# Patient Record
Sex: Female | Born: 1990 | Race: Black or African American | Hispanic: No | Marital: Single | Smoking: Never smoker
Health system: Southern US, Community
[De-identification: ages and names within clinical notes are randomized; demographics above are authoritative.]

## PROBLEM LIST (undated history)

## (undated) ENCOUNTER — Inpatient Hospital Stay (HOSPITAL_COMMUNITY): Payer: Self-pay

## (undated) DIAGNOSIS — D649 Anemia, unspecified: Secondary | ICD-10-CM

## (undated) DIAGNOSIS — N159 Renal tubulo-interstitial disease, unspecified: Secondary | ICD-10-CM

## (undated) HISTORY — PX: NO PAST SURGERIES: SHX2092

---
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
## 2009-10-16 ENCOUNTER — Emergency Department (HOSPITAL_COMMUNITY): Admission: EM | Admit: 2009-10-16 | Discharge: 2009-10-16 | Payer: Self-pay | Admitting: Emergency Medicine

## 2010-07-14 ENCOUNTER — Emergency Department (HOSPITAL_COMMUNITY)
Admission: EM | Admit: 2010-07-14 | Discharge: 2010-07-14 | Payer: Self-pay | Source: Home / Self Care | Admitting: Emergency Medicine

## 2010-07-18 LAB — WET PREP, GENITAL

## 2010-07-18 LAB — POCT URINALYSIS DIPSTICK
Bilirubin Urine: NEGATIVE
Ketones, ur: NEGATIVE mg/dL
Urine Glucose, Fasting: NEGATIVE mg/dL
Urobilinogen, UA: 0.2 mg/dL (ref 0.0–1.0)

## 2010-07-19 LAB — HERPES SIMPLEX VIRUS CULTURE: Culture: NOT DETECTED

## 2010-09-07 ENCOUNTER — Inpatient Hospital Stay (INDEPENDENT_AMBULATORY_CARE_PROVIDER_SITE_OTHER)
Admission: RE | Admit: 2010-09-07 | Discharge: 2010-09-07 | Disposition: A | Discharge status: Home / Self Care | Payer: BC Managed Care – PPO | Source: Ambulatory Visit | Attending: Family Medicine | Admitting: Family Medicine

## 2010-09-07 DIAGNOSIS — R197 Diarrhea, unspecified: Secondary | ICD-10-CM

## 2010-09-07 DIAGNOSIS — R112 Nausea with vomiting, unspecified: Secondary | ICD-10-CM

## 2010-09-07 LAB — POCT PREGNANCY, URINE: Preg Test, Ur: NEGATIVE

## 2010-09-13 LAB — WET PREP, GENITAL
Clue Cells Wet Prep HPF POC: NONE SEEN
WBC, Wet Prep HPF POC: NONE SEEN

## 2010-09-13 LAB — DIFFERENTIAL
Basophils Absolute: 0 10*3/uL (ref 0.0–0.1)
Basophils Relative: 1 % (ref 0–1)
Eosinophils Absolute: 0.2 10*3/uL (ref 0.0–0.7)
Eosinophils Relative: 5 % (ref 0–5)
Lymphocytes Relative: 36 % (ref 12–46)
Neutro Abs: 2.1 10*3/uL (ref 1.7–7.7)

## 2010-09-13 LAB — CBC
HCT: 35.4 % — ABNORMAL LOW (ref 36.0–46.0)
MCHC: 31.3 g/dL (ref 30.0–36.0)
MCV: 67 fL — ABNORMAL LOW (ref 78.0–100.0)
Platelets: 292 10*3/uL (ref 150–400)
RDW: 16.8 % — ABNORMAL HIGH (ref 11.5–15.5)

## 2010-09-13 LAB — POCT I-STAT, CHEM 8
Calcium, Ion: 1.2 mmol/L (ref 1.12–1.32)
Creatinine, Ser: 0.7 mg/dL (ref 0.4–1.2)
HCT: 40 % (ref 36.0–46.0)
Sodium: 139 mEq/L (ref 135–145)
TCO2: 25 mmol/L (ref 0–100)

## 2010-09-13 LAB — URINALYSIS, ROUTINE W REFLEX MICROSCOPIC
Bilirubin Urine: NEGATIVE
Hgb urine dipstick: NEGATIVE
Ketones, ur: NEGATIVE mg/dL
Specific Gravity, Urine: 1.025 (ref 1.005–1.030)

## 2010-09-13 LAB — GC/CHLAMYDIA PROBE AMP, GENITAL: GC Probe Amp, Genital: NEGATIVE

## 2010-09-13 LAB — POCT PREGNANCY, URINE: Preg Test, Ur: NEGATIVE

## 2011-09-19 ENCOUNTER — Emergency Department (INDEPENDENT_AMBULATORY_CARE_PROVIDER_SITE_OTHER)
Admission: EM | Admit: 2011-09-19 | Discharge: 2011-09-19 | Disposition: A | Discharge status: Home / Self Care | Payer: BC Managed Care – PPO | Source: Home / Self Care

## 2011-09-19 ENCOUNTER — Encounter (HOSPITAL_COMMUNITY): Payer: Self-pay | Admitting: Emergency Medicine

## 2011-09-19 DIAGNOSIS — L03119 Cellulitis of unspecified part of limb: Secondary | ICD-10-CM

## 2011-09-19 DIAGNOSIS — L02419 Cutaneous abscess of limb, unspecified: Secondary | ICD-10-CM

## 2011-09-19 MED ORDER — DOXYCYCLINE HYCLATE 100 MG PO CAPS: 100.0000 mg | ORAL_CAPSULE | Freq: Two times a day (BID) | ORAL | Status: AC

## 2011-09-19 NOTE — ED Provider Notes (Signed)
History     CSN: [id number]  Arrival date & time 09/19/11  1043   None     Chief Complaint  Patient presents with  . Insect Bite    (Consider location/radiation/quality/duration/timing/severity/associated sxs/prior treatment) HPI Comments: Patient presents today with complaints of a sore red area to her lateral left knee. No drainage.This began 2 days ago and is gradually increasing in size. No injury. No history of same. She has been taking Benadryl for her symptoms without improvement. She has not tried any over-the-counter pain relievers.   History reviewed. No pertinent past medical history.  History reviewed. No pertinent past surgical history.  History reviewed. No pertinent family history.  History  Substance Use Topics  . Smoking status: Never Smoker   . Smokeless tobacco: Not on file  . Alcohol Use: No    OB History    Grav Para Term Preterm Abortions TAB SAB Ect Mult Living                  Review of Systems  Constitutional: Negative for fever and chills.  Musculoskeletal: Negative for joint swelling.  Skin: Negative for wound.    Allergies  Review of patient's allergies indicates no known allergies.  Home Medications   Current Outpatient Rx  Name Route Sig Dispense Refill  . DOXYCYCLINE HYCLATE 100 MG PO CAPS Oral Take 1 capsule (100 mg total) by mouth 2 (two) times daily. 20 capsule 0    BP 96/58  Pulse 90  Temp(Src) 97.3 F (36.3 C) (Oral)  Resp 14  SpO2 99%  Physical Exam  Nursing note and vitals reviewed. Constitutional: She appears well-developed and well-nourished. No distress.  Musculoskeletal:       Left knee: She exhibits erythema. She exhibits normal range of motion, no swelling, no effusion, no laceration and no bony tenderness.       Legs: Neurological: She is alert.  Skin: Skin is warm and dry.  Psychiatric: She has a normal mood and affect.    ED Course  Procedures (including critical care time)  Labs Reviewed - No  data to display No results found.   1. Cellulitis of leg       MDM  Cellulitis of lateral Lt knee, w/o evidence of abscess or joint involvement.         Melody Comas, [place] 09/19/11 1308

## 2011-09-19 NOTE — Discharge Instructions (Signed)
Tylenol or Ibuprofen as needed for discomfort. Warm compresses 3-4 times a day for 15-20 minutes. Take the antibiotics as prescribed. Return in 2 days for recheck.  Cellulitis Cellulitis is an infection of the tissue under the skin. The infected area is usually red and tender. This is caused by germs. These germs enter the body through cuts or sores. This usually happens in the arms or lower legs. HOME CARE   Take your medicine as told. Finish it even if you start to feel better.   If the infection is on the arm or leg, keep it raised (elevated).   Use a warm cloth on the infected area several times a day.   See your doctor for a follow-up visit as told.  GET HELP RIGHT AWAY IF:   You are tired or confused.   You throw up (vomit).   You have watery poop (diarrhea).   You feel ill and have muscle aches.   You have a fever.  MAKE SURE YOU:   Understand these instructions.   Will watch your condition.   Will get help right away if you are not doing well or get worse.  Document Released: 11/29/2007 Document Revised: 06/01/2011 Document Reviewed: 05/14/2009 [facility] Patient Information 2012 [place], [place].

## 2011-09-19 NOTE — ED Notes (Signed)
Noticed Sunday soreness to left leg, by Sunday evening area was dime -sized, since then area has increased in size, bump in cent or red area, small white cap to bump.

## 2011-09-20 NOTE — ED Provider Notes (Signed)
Medical screening examination/treatment/procedure(s) were performed by non-physician practitioner and as supervising physician I was immediately available for consultation/collaboration.  Angelize Ryce, M.D.   Ralphie Lovelady C Pa Tennant, MD 09/20/11 2225 

## 2012-05-20 ENCOUNTER — Emergency Department (HOSPITAL_COMMUNITY)
Admission: EM | Admit: 2012-05-20 | Discharge: 2012-05-20 | Disposition: A | Discharge status: Home / Self Care | Payer: BC Managed Care – PPO | Source: Home / Self Care | Attending: Emergency Medicine | Admitting: Emergency Medicine

## 2012-05-20 ENCOUNTER — Encounter (HOSPITAL_COMMUNITY): Payer: Self-pay | Admitting: Emergency Medicine

## 2012-05-20 DIAGNOSIS — L089 Local infection of the skin and subcutaneous tissue, unspecified: Secondary | ICD-10-CM

## 2012-05-20 DIAGNOSIS — B958 Unspecified staphylococcus as the cause of diseases classified elsewhere: Secondary | ICD-10-CM

## 2012-05-20 DIAGNOSIS — L0390 Cellulitis, unspecified: Secondary | ICD-10-CM

## 2012-05-20 DIAGNOSIS — L0291 Cutaneous abscess, unspecified: Secondary | ICD-10-CM

## 2012-05-20 MED ORDER — CHLORHEXIDINE GLUCONATE 4 % EX LIQD: 60.0000 mL | Freq: Every day | CUTANEOUS | Status: DC | PRN

## 2012-05-20 MED ORDER — SULFAMETHOXAZOLE-TRIMETHOPRIM 800-160 MG PO TABS: 1.0000 | ORAL_TABLET | Freq: Two times a day (BID) | ORAL | Status: DC

## 2012-05-20 NOTE — ED Notes (Signed)
Provided with work note.

## 2012-05-20 NOTE — ED Provider Notes (Signed)
History     CSN: [id number]  Arrival date & time 05/20/12  0940   First MD Initiated Contact with Patient 05/20/12 1146      Chief Complaint  Patient presents with  . Insect Bite    (Consider location/radiation/quality/duration/timing/severity/associated sxs/prior treatment) Patient is a 21 y.o. female presenting with rash. The history is provided by the patient.  Rash  This is a new problem. The problem has been gradually worsening. The problem is associated with nothing. There has been no fever. The rash is present on the right buttock. The pain is mild. The pain has been constant since onset. Associated symptoms include blisters, itching and pain.  This patient presents for evaluation of a cutaneous abscess.  The largest lesion is located in the right buttocks, but reports smaller ones on multiple areas of her body.  Onset was 5 days ago.  Symptoms have worsened.  Abscess has associated symptoms of tenderness.  The patient does not have previous history of cutaneous abscesses.  There is no known previous history of MRSA.   They do not have a history of diabetes. Has applied warm compresses with no change.  History reviewed. No pertinent past medical history.  History reviewed. No pertinent past surgical history.  History reviewed. No pertinent family history.  History  Substance Use Topics  . Smoking status: Never Smoker   . Smokeless tobacco: Not on file  . Alcohol Use: No    OB History    Grav Para Term Preterm Abortions TAB SAB Ect Mult Living                  Review of Systems  Skin: Positive for itching and rash.  All other systems reviewed and are negative.    Allergies  Review of patient's allergies indicates no known allergies.  Home Medications   Current Outpatient Rx  Name  Route  Sig  Dispense  Refill  . CHLORHEXIDINE GLUCONATE 4 % EX LIQD   Topical   Apply 60 mLs (4 application total) topically daily as needed.   120 mL   0   .  SULFAMETHOXAZOLE-TRIMETHOPRIM 800-160 MG PO TABS   Oral   Take 1 tablet by mouth 2 (two) times daily.   14 tablet   0     BP 115/76  Pulse 76  Temp 98.6 F (37 C) (Oral)  Resp 16  SpO2 100%  Physical Exam  Nursing note and vitals reviewed. Constitutional: She is oriented to person, place, and time. Vital signs are normal. She appears well-developed and well-nourished. She is active and cooperative.  HENT:  Head: Normocephalic.  Eyes: Conjunctivae normal are normal. Pupils are equal, round, and reactive to light. No scleral icterus.  Neck: Trachea normal. Neck supple.  Cardiovascular: Normal rate, regular rhythm and normal heart sounds.   Pulmonary/Chest: Effort normal and breath sounds normal.  Lymphadenopathy:    She has no cervical adenopathy.  Neurological: She is alert and oriented to person, place, and time. No cranial nerve deficit or sensory deficit.  Skin: Skin is warm and dry. Lesion noted.     Psychiatric: She has a normal mood and affect. Her speech is normal and behavior is normal. Judgment and thought content normal. Cognition and memory are normal.    ED Course  Procedures (including critical care time)  Labs Reviewed - No data to display No results found.   1. Abscess   2. Staph skin infection       MDM  Sitz  baths tid, antibiotics as prescribed, follow up in 3 days at [facility] for possible I&D if conservative measures do not improve symptoms.          Erica Kindred, NP 05/21/12 1830

## 2012-05-20 NOTE — ED Notes (Signed)
Reports five days ago she saw one bump on her right inner thigh.  Three days later patient noticed two more bumps right thigh.  Patient says now she has one on her left and right butt cheek and left breast bump appeared last night.  Reports yellowish drainage on right butt and inner thigh.   Has tried OTC medications and pop bumps but no relief.

## 2012-05-22 NOTE — ED Provider Notes (Signed)
Medical screening examination/treatment/procedure(s) were performed by non-physician practitioner and as supervising physician I was immediately available for consultation/collaboration.  Raynald Blend, MD 05/22/12 1145
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
## 2013-09-03 ENCOUNTER — Encounter (HOSPITAL_COMMUNITY): Payer: Self-pay | Admitting: Emergency Medicine

## 2013-09-03 ENCOUNTER — Emergency Department (HOSPITAL_COMMUNITY)
Admission: EM | Admit: 2013-09-03 | Discharge: 2013-09-03 | Disposition: A | Discharge status: Home / Self Care | Payer: BC Managed Care – PPO | Attending: Emergency Medicine | Admitting: Emergency Medicine

## 2013-09-03 DIAGNOSIS — IMO0002 Reserved for concepts with insufficient information to code with codable children: Secondary | ICD-10-CM | POA: Insufficient documentation

## 2013-09-03 DIAGNOSIS — Y939 Activity, unspecified: Secondary | ICD-10-CM | POA: Insufficient documentation

## 2013-09-03 DIAGNOSIS — T192XXA Foreign body in vulva and vagina, initial encounter: Secondary | ICD-10-CM | POA: Insufficient documentation

## 2013-09-03 DIAGNOSIS — Y929 Unspecified place or not applicable: Secondary | ICD-10-CM | POA: Insufficient documentation

## 2013-09-03 NOTE — Discharge Instructions (Signed)
Please call tomorrow for followup Return to er earlier for worsened pain, swelling redness or drainage

## 2013-09-03 NOTE — ED Provider Notes (Signed)
CSN: [id number]     Arrival date & time 09/03/13  2045 History   First MD Initiated Contact with Patient 09/03/13 2119     Chief Complaint  Patient presents with  . Foreign Body in Vagina      HPI Pt presents for possible foreign body in clitoris She reports she had body piercing to her clitoris last week.  She reports after the procedure she began to have swelling so she returned to the clinic, they removed one of the balls at the end of the piercing but then the piercing became "Stuck" and is unable to be removed. No fever/vomiting She denies abd pain She reports only mild tenderness to the area    PMH - none Pt denies pregnancy   History reviewed. No pertinent past surgical history. History reviewed. No pertinent family history. History  Substance Use Topics  . Smoking status: Never Smoker   . Smokeless tobacco: Not on file  . Alcohol Use: No   OB History   Grav Para Term Preterm Abortions TAB SAB Ect Mult Living                 Review of Systems  Constitutional: Negative for fever.  Skin: Positive for wound.      Allergies  Review of patient's allergies indicates no known allergies.  Home Medications  No current outpatient prescriptions on file. BP 109/97  Pulse 77  Temp(Src) 98.1 F (36.7 C)  Resp 20  Ht 5\' 1"  (1.549 m)  Wt 128 lb (58.06 kg)  BMI 24.20 kg/m2  SpO2 100%  LMP 06/11/2013 Physical Exam CONSTITUTIONAL: Well developed/well nourished HEAD: Normocephalic/atraumatic EYES: EOMI ENMT: Mucous membranes moist NECK: supple no meningeal signs LUNGS:  no apparent distress ABDOMEN: soft,  WU:[id number]:piercing noted in clitoris.  It is completely embedded under skin.  No erythema/drainage/crepitance noted.  Female chaperone present NEURO: Pt is awake/alert, moves all extremitiesx4 EXTREMITIES: pulses normal, full ROM SKIN: warm, color normal PSYCH: no abnormalities of mood noted  ED Course  Procedures   I was unable to manually extract the  piercing.  She is otherwise well appearing and in no distress I advised f/u as outpatient with gynecologist to have piercing removed.   Pt is agreeable with this plan  MDM   Final diagnoses:  Foreign body in vagina    Nursing notes including past medical history and social history reviewed and considered in documentation     Joya Gaskinsonald W Loyola Santino, MD 09/03/13 2238

## 2013-09-03 NOTE — ED Notes (Addendum)
Pt had her clitoris pierced on Friday and the her the skin has swelled over the ball piece of the jewelry. Pt went to back to where she was pierced, they tried to push the ball back through the skin and it was to painful for pt.

## 2013-09-04 ENCOUNTER — Encounter (HOSPITAL_COMMUNITY): Payer: Self-pay | Admitting: *Deleted

## 2013-09-04 ENCOUNTER — Inpatient Hospital Stay (HOSPITAL_COMMUNITY)
Admission: AD | Admit: 2013-09-04 | Discharge: 2013-09-04 | Disposition: A | Discharge status: Home / Self Care | Payer: BC Managed Care – PPO | Source: Ambulatory Visit | Attending: Obstetrics & Gynecology | Admitting: Obstetrics & Gynecology

## 2013-09-04 DIAGNOSIS — S39848A Other specified injuries of external genitals, initial encounter: Secondary | ICD-10-CM | POA: Insufficient documentation

## 2013-09-04 DIAGNOSIS — Z1889 Other specified retained foreign body fragments: Secondary | ICD-10-CM | POA: Insufficient documentation

## 2013-09-04 DIAGNOSIS — N764 Abscess of vulva: Secondary | ICD-10-CM

## 2013-09-04 DIAGNOSIS — L089 Local infection of the skin and subcutaneous tissue, unspecified: Secondary | ICD-10-CM

## 2013-09-04 DIAGNOSIS — S3994XA Unspecified injury of external genitals, initial encounter: Secondary | ICD-10-CM

## 2013-09-04 DIAGNOSIS — X58XXXA Exposure to other specified factors, initial encounter: Secondary | ICD-10-CM | POA: Insufficient documentation

## 2013-09-04 HISTORY — DX: Anemia, unspecified: D64.9

## 2013-09-04 LAB — POCT PREGNANCY, URINE: PREG TEST UR: NEGATIVE

## 2013-09-04 MED ORDER — SULFAMETHOXAZOLE-TMP DS 800-160 MG PO TABS: 1.0000 | ORAL_TABLET | Freq: Two times a day (BID) | ORAL | Status: DC

## 2013-09-04 MED ORDER — IBUPROFEN 600 MG PO TABS: 600.0000 mg | ORAL_TABLET | Freq: Four times a day (QID) | ORAL | Status: DC | PRN

## 2013-09-04 MED ORDER — HYDROMORPHONE HCL PF 1 MG/ML IJ SOLN
1.0000 mg | Freq: Once | INTRAMUSCULAR | Status: AC
  Administered 2013-09-04: 1 mg via INTRAVENOUS
  Filled 2013-09-04: qty 1

## 2013-09-04 MED ORDER — LIDOCAINE HCL 2 % EX GEL
Freq: Once | CUTANEOUS | Status: AC
  Administered 2013-09-04: 5 via TOPICAL
  Filled 2013-09-04: qty 5

## 2013-09-04 MED ORDER — ACETAMINOPHEN-CODEINE #3 300-30 MG PO TABS: 1.0000 | ORAL_TABLET | Freq: Four times a day (QID) | ORAL | Status: DC | PRN

## 2013-09-04 NOTE — MAU Note (Signed)
Pt reports she had a clitoral piercing last week. Stated she noticed 3 days ago  the ball part of the piercing was missing. Went back to were she got it done and was told that it was imbeded in her clitoral tissue. Attempted to remove unable . [facility] Pt stated she is stared to have some pain in the area now.

## 2013-09-04 NOTE — Discharge Instructions (Signed)
Vulva Incision Care After Refer to this sheet in the next few weeks. These instructions provide you with information on caring for yourself after your procedure. Your caregiver may also give you more specific instructions. Your treatment has been planned according to current medical practices, but problems sometimes occur. Call your caregiver if you have any problems or questions after your procedure.  HOME CARE INSTRUCTIONS  Only take over-the-counter or prescription medicines for pain or discomfort as directed by your caregiver.  Do not rub the incision area after urinating. Gently pat the area dry or use a bottle filled with warm water (peri-bottle) to clean the area. Gently wipe from front to back.  Clean the area using water and mild soap. Gently pat the area dry.  Check your incision site with a handheld mirror daily to be sure it is healing properly.  Do not have intercourse for 7 days or as directed by your caregiver.  Avoid exercise, such as running or biking, for 2-3 days or as directed by your caregiver.  You may shower after the procedure. Avoid bathing and swimming until the sutures dissolve and healing is complete.  Wear loose cotton underwear. Do not wear tight pants.  Keep all follow-up appointments with your caregiver.  Contact your caregiver to obtain your test results. SEEK IMMEDIATE MEDICAL CARE IF:  Your pain increases and medicine does not help it.  Your vaginal area becomes swollen or red.  You have fluid or an abnormally bad smell coming from your vagina.  You have a fever. MAKE SURE YOU:  Understand these instructions.  Will watch your condition.  Will get help right away if you are not doing well or get worse. Document Released: 05/29/2012 Document Reviewed: 05/29/2012 [facility] Patient Information 2014 [place].

## 2013-09-04 NOTE — MAU Provider Note (Signed)
OB/GYN Attending MAU Note   Chief Complaint: Foreign Body in Vagina  First Provider Initiated Contact with Patient 09/04/13 1502     SUBJECTIVE HPI: Erica Jennings is a 23 y.o. G0 who presents with report of embedded clitoral ring remnant. Ring used looked like the one below.  This was performed one week ago, and the top ball fell off a few days after placement.   The bottom ball then went through the incision on the inferior part of the clitoral hood and got embedded in the clitoral hood. Patient tried to remove it herself but was unsuccessful. She also went to the piercing place and they tried to remove it, but were unsuccessful. In the meantime, the inferior incision became scarred over.  She went to [facility] on 09/03/13, but the attempt to extract it was unsuccessful. She was told to follow up with GYN so she decided to come here today. She denies any fevers, purulent drainage, bleeding or other concerns.   Past Medical History  Diagnosis Date  . Anemia    OB History  Gravida Para Term Preterm AB SAB TAB Ectopic Multiple Living  0                Past Surgical History  Procedure Laterality Date  . No past surgeries     History   Social History  . Marital Status: Single    Spouse Name: N/A    Number of Children: N/A  . Years of Education: N/A   Occupational History  . Not on file.   Social History Main Topics  . Smoking status: Never Smoker   . Smokeless tobacco: Not on file  . Alcohol Use: No  . Drug Use: No  . Sexual Activity: Yes    Birth Control/ Protection: Injection   Other Topics Concern  . Not on file   Social History Narrative  . No narrative on file   No current facility-administered medications on file prior to encounter.   No current outpatient prescriptions on file prior to encounter.   No Known Allergies  ROS: Pertinent items in HPI  OBJECTIVE Blood pressure 106/53, pulse 94, temperature 98.3 F (36.8 C), temperature source Oral,  resp. rate 18, last menstrual period 06/11/2013. GENERAL: Well-developed, well-nourished female in no acute distress.  HEENT: Normocephalic, atraumatic HEART: Regular rate and regular RESP: Normal effort, no breathing difficult ABDOMEN: Soft, non-tender, non-distended EXTREMITIES: Nontender, no edema PELVIC: Tender and mildly enlarged clitoral hood noted.  2mm opening noted on anterior surface, top of ring stem noted with absent ball top as described. No erythema/drainage/crepitance noted.  Xylocaine jelly applied.  Able to palpate stem of ring and ball part within the clitoral hood, attempted to push it through the surface but was unable to due to size of the ball part.  Patient very, very uncomfortable during exam. Patient was counseled about removing it under local analgesia and Dilaudid, and she agreed.  PROCEDURE Patient was given Dilaudid 1 mg IM after Lidocaine jelly topical analgesia was placed around the clitoral hood. The Lidocaine jelly was not able to give enough local analgesia, so area was cleansed with an alcohol swab and 1% lidocaine was injected around the piercing site.  This was able to give adequate analgesia, and the area was swabbed with betadine.  A 7 mm vertical incision was made around the piercing, and the stem of the ring was grasped with hemostats. This was used to bring the ball part to the surface and  some scar tissue had to be excised to get the entire ring out intact.  There was minimal bleeding noted.  The defect was approximated with one interrupted 'U' stitch of 4-0 Monocryl.  Patient tolerated the procedure well.   LAB RESULTS Results for orders placed during the hospital encounter of 09/04/13 (from the past 24 hour(s))  POCT PREGNANCY, URINE     Status: None   Collection Time    09/04/13  2:12 PM      Result Value Ref Range   Preg Test, Ur NEGATIVE  NEGATIVE     ASSESSMENT 1. Pierced clitoris complication   s/p removal of foreign body from clitoral  hood  PLAN Discharge home Patient was told to abstain from intercourse for at least one week, and advised to use sitz baths.  She was given Tylenol# 3, Ibuprofen and Bactrim DS x 5 days, and told to follow up for any concerns.  Follow-up Information   [facility] (If symptoms worsen or as needed)    Contact information:   [address] [id number] [place] [phone]       Medication List         acetaminophen-codeine 300-30 MG per tablet  Commonly known as:  TYLENOL #3  Take 1-2 tablets by mouth every 6 (six) hours as needed for moderate pain.     ibuprofen 600 MG tablet  Commonly known as:  ADVIL,MOTRIN  Take 1 tablet (600 mg total) by mouth every 6 (six) hours as needed.     sulfamethoxazole-trimethoprim 800-160 MG per tablet  Commonly known as:  BACTRIM DS  Take 1 tablet by mouth 2 (two) times daily.         Tereso NewcomerUgonna A Caressa Scearce, MD 09/04/2013 5:26 PM

## 2013-09-27 ENCOUNTER — Encounter (HOSPITAL_COMMUNITY): Payer: Self-pay | Admitting: Emergency Medicine

## 2013-09-27 ENCOUNTER — Emergency Department (HOSPITAL_COMMUNITY)
Admission: EM | Admit: 2013-09-27 | Discharge: 2013-09-27 | Discharge status: Against Medical Advice | Payer: BC Managed Care – PPO | Attending: Emergency Medicine | Admitting: Emergency Medicine

## 2013-09-27 DIAGNOSIS — W268XXA Contact with other sharp object(s), not elsewhere classified, initial encounter: Secondary | ICD-10-CM | POA: Insufficient documentation

## 2013-09-27 DIAGNOSIS — S0180XA Unspecified open wound of other part of head, initial encounter: Secondary | ICD-10-CM | POA: Insufficient documentation

## 2013-09-27 DIAGNOSIS — Y939 Activity, unspecified: Secondary | ICD-10-CM | POA: Insufficient documentation

## 2013-09-27 DIAGNOSIS — Y9289 Other specified places as the place of occurrence of the external cause: Secondary | ICD-10-CM | POA: Insufficient documentation

## 2013-09-27 NOTE — ED Provider Notes (Signed)
Pt no longer in room when i entered for initial evaluation.  I did not see or examine pt.  She LWBS after triage.  Garlon HatchetLisa M Kaijah Abts, PA-C 09/27/13 [phone]

## 2013-09-27 NOTE — ED Notes (Signed)
Pt. Left AMA  

## 2013-09-27 NOTE — ED Notes (Signed)
Pt. Refused wound to be cleaned up before pain med. Given, will notify  MD.
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
## 2013-09-28 NOTE — ED Provider Notes (Signed)
Medical screening examination/treatment/procedure(s) were performed by non-physician practitioner and as supervising physician I was immediately available for consultation/collaboration.   EKG Interpretation None        Zaniel Marineau, MD 09/28/13 2318 

## 2013-11-24 ENCOUNTER — Encounter (HOSPITAL_COMMUNITY): Payer: Self-pay

## 2013-11-24 ENCOUNTER — Inpatient Hospital Stay (HOSPITAL_COMMUNITY): Payer: BC Managed Care – PPO

## 2013-11-24 ENCOUNTER — Inpatient Hospital Stay (HOSPITAL_COMMUNITY)
Admission: AD | Admit: 2013-11-24 | Discharge: 2013-11-24 | Disposition: A | Discharge status: Home / Self Care | Payer: BC Managed Care – PPO | Source: Ambulatory Visit | Attending: Obstetrics & Gynecology | Admitting: Obstetrics & Gynecology

## 2013-11-24 DIAGNOSIS — N9489 Other specified conditions associated with female genital organs and menstrual cycle: Secondary | ICD-10-CM | POA: Insufficient documentation

## 2013-11-24 DIAGNOSIS — O2390 Unspecified genitourinary tract infection in pregnancy, unspecified trimester: Secondary | ICD-10-CM

## 2013-11-24 DIAGNOSIS — O98819 Other maternal infectious and parasitic diseases complicating pregnancy, unspecified trimester: Secondary | ICD-10-CM | POA: Insufficient documentation

## 2013-11-24 DIAGNOSIS — R109 Unspecified abdominal pain: Secondary | ICD-10-CM | POA: Insufficient documentation

## 2013-11-24 DIAGNOSIS — O23599 Infection of other part of genital tract in pregnancy, unspecified trimester: Secondary | ICD-10-CM

## 2013-11-24 DIAGNOSIS — O26899 Other specified pregnancy related conditions, unspecified trimester: Secondary | ICD-10-CM

## 2013-11-24 DIAGNOSIS — M549 Dorsalgia, unspecified: Secondary | ICD-10-CM | POA: Insufficient documentation

## 2013-11-24 DIAGNOSIS — A5901 Trichomonal vulvovaginitis: Secondary | ICD-10-CM | POA: Insufficient documentation

## 2013-11-24 LAB — URINALYSIS, ROUTINE W REFLEX MICROSCOPIC
Bilirubin Urine: NEGATIVE
GLUCOSE, UA: NEGATIVE mg/dL
HGB URINE DIPSTICK: NEGATIVE
Ketones, ur: NEGATIVE mg/dL
NITRITE: NEGATIVE
PH: 6.5 (ref 5.0–8.0)
PROTEIN: NEGATIVE mg/dL
SPECIFIC GRAVITY, URINE: 1.02 (ref 1.005–1.030)
Urobilinogen, UA: 0.2 mg/dL (ref 0.0–1.0)

## 2013-11-24 LAB — URINE MICROSCOPIC-ADD ON

## 2013-11-24 LAB — POCT PREGNANCY, URINE: PREG TEST UR: POSITIVE — AB

## 2013-11-24 MED ORDER — METOCLOPRAMIDE HCL 10 MG PO TABS
10.0000 mg | ORAL_TABLET | Freq: Once | ORAL | Status: AC
  Administered 2013-11-24: 10 mg via ORAL
  Filled 2013-11-24: qty 1

## 2013-11-24 MED ORDER — METOCLOPRAMIDE HCL 10 MG PO TABS: 10.0000 mg | ORAL_TABLET | Freq: Four times a day (QID) | ORAL | Status: DC | PRN

## 2013-11-24 MED ORDER — METRONIDAZOLE 500 MG PO TABS
2000.0000 mg | ORAL_TABLET | Freq: Once | ORAL | Status: AC
  Administered 2013-11-24: 2000 mg via ORAL
  Filled 2013-11-24: qty 4

## 2013-11-24 MED ORDER — OXYCODONE-ACETAMINOPHEN 5-325 MG PO TABS
1.0000 | ORAL_TABLET | Freq: Once | ORAL | Status: AC
  Administered 2013-11-24: 1 via ORAL
  Filled 2013-11-24: qty 1

## 2013-11-24 MED ORDER — METRONIDAZOLE 500 MG PO TABS: 2000.0000 mg | ORAL_TABLET | Freq: Once | ORAL | Status: DC

## 2013-11-24 MED ORDER — PROMETHAZINE HCL 25 MG PO TABS
25.0000 mg | ORAL_TABLET | Freq: Once | ORAL | Status: AC
  Administered 2013-11-24: 25 mg via ORAL
  Filled 2013-11-24: qty 1

## 2013-11-24 NOTE — Discharge Instructions (Signed)
Abdominal Pain During Pregnancy °Belly (abdominal) pain is common during pregnancy. Most of the time, it is not a serious problem. Other times, it can be a sign that something is wrong with the pregnancy. Always tell your doctor if you have belly pain. °HOME CARE °Monitor your belly pain for any changes. The following actions may help you feel better: °· Do not have sex (intercourse) or put anything in your vagina until you feel better. °· Rest until your pain stops. °· Drink clear fluids if you feel sick to your stomach (nauseous). Do not eat solid food until you feel better. °· Only take medicine as told by your doctor. °· Keep all doctor visits as told. °GET HELP RIGHT AWAY IF:  °· You are bleeding, leaking fluid, or pieces of tissue come out of your vagina. °· You have more pain or cramping. °· You keep throwing up (vomiting). °· You have pain when you pee (urinate) or have blood in your pee. °· You have a fever. °· You do not feel your baby moving as much. °· You feel very weak or feel like passing out. °· You have trouble breathing, with or without belly pain. °· You have a very bad headache and belly pain. °· You have fluid leaking from your vagina and belly pain. °· You keep having watery poop (diarrhea). °· Your belly pain does not go away after resting, or the pain gets worse. °MAKE SURE YOU:  °· Understand these instructions. °· Will watch your condition. °· Will get help right away if you are not doing well or get worse. °Document Released: 05/31/2009 Document Revised: 02/12/2013 Document Reviewed: 01/09/2013 °Exit[employer]® Patient Information ©2014 [employer]. °Trichomoniasis °Trichomoniasis is an infection, caused by the Trichomonas organism, that affects both women and men. In women, the outer female genitalia and the vagina are affected. In men, the penis is mainly affected, but the prostate and other reproductive organs can also be involved. Trichomoniasis is a sexually transmitted disease (STD) and  is most often passed to another person through sexual contact. The majority of people who get trichomoniasis do so from a sexual encounter and are also at risk for other STDs. °CAUSES  °· Sexual intercourse with an infected partner. °· It can be present in swimming pools or hot tubs. °SYMPTOMS  °· Abnormal gray-green frothy vaginal discharge in women. °· Vaginal itching and irritation in women. °· Itching and irritation of the area outside the vagina in women. °· Penile discharge with or without pain in males. °· Inflammation of the urethra (urethritis), causing painful urination. °· Bleeding after sexual intercourse. °RELATED COMPLICATIONS °· Pelvic inflammatory disease. °· Infection of the uterus (endometritis). °· Infertility. °· Tubal (ectopic) pregnancy. °· It can be associated with other STDs, including gonorrhea and chlamydia, hepatitis B, and HIV. °COMPLICATIONS DURING PREGNANCY °· Early (premature) delivery. °· Premature rupture of the membranes (PROM). °· Low birth weight. °DIAGNOSIS  °· Visualization of Trichomonas under the microscope from the vagina discharge. °· Ph of the vagina greater than 4.5, tested with a test tape. °· Trich Rapid Test. °· Culture of the organism, but this is not usually needed. °· It may be found on a Pap test. °· Having a "strawberry cervix,"which means the cervix looks very red like a strawberry. °TREATMENT  °· You may be given medication to fight the infection. Inform your caregiver if you could be or are pregnant. Some medications used to treat the infection should not be taken during pregnancy. °· Over-the-counter medications   or creams to decrease itching or irritation may be recommended. °· Your sexual partner will need to be treated if infected. °HOME CARE INSTRUCTIONS  °· Take all medication prescribed by your caregiver. °· Take over-the-counter medication for itching or irritation as directed by your caregiver. °· Do not have sexual intercourse while you have the  infection. °· Do not douche or wear tampons. °· Discuss your infection with your partner, as your partner may have acquired the infection from you. Or, your partner may have been the person who transmitted the infection to you. °· Have your sex partner examined and treated if necessary. °· Practice safe, informed, and protected sex. °· See your caregiver for other STD testing. °SEEK MEDICAL CARE IF:  °· You still have symptoms after you finish the medication. °· You have an oral temperature above 102° F (38.9° C). °· You develop belly (abdominal) pain. °· You have pain when you urinate. °· You have bleeding after sexual intercourse. °· You develop a rash. °· The medication makes you sick or makes you throw up (vomit). °Document Released: 12/06/2000 Document Revised: 09/04/2011 Document Reviewed: 01/01/2009 °ExitCare® Patient Information ©2014 [employer]. ° °

## 2013-11-24 NOTE — MAU Provider Note (Signed)
History     CSN: [id number]  Arrival date and time: 11/24/13 1251   First Provider Initiated Contact with Patient 11/24/13 1317      Chief Complaint  Patient presents with  . Back Pain  . Abdominal Pain   HPI This is a 23 y.o. at [id number] who presents with c/o + UPT at [facility]. C/O back and abdominal pain which got worse today. [facility]   [facility]. Had a quant and US at [facility] showing a 5.5wk embryo with heartbeat of 108. States they dont take her insurance so has an app[facility]. Pain got worse last night.  RN Note:  Patient states she has had a positive pregnancy test at [facility] but does not go to them for care. States she has been having back and abdominal pain for "weeks" but got worse at 0130. Denies bleeding, discharge, vomiting. Has nausea all the time.        OB History   Grav Para Term Preterm Abortions TAB SAB Ect Mult Living   1               Past Medical History  Diagnosis Date  . Anemia     Past Surgical History  Procedure Laterality Date  . No past surgeries      Family History  Problem Relation Age of Onset  . Alcohol abuse Neg Hx   . Arthritis Neg Hx   . Asthma Neg Hx   . Birth defects Neg Hx   . Cancer Neg Hx   . COPD Neg Hx   . Depression Neg Hx   . Diabetes Neg Hx   . Drug abuse Neg Hx   . Early death Neg Hx   . Hearing loss Neg Hx   . Heart disease Neg Hx   . Hyperlipidemia Neg Hx   . Hypertension Neg Hx   . Kidney disease Neg Hx   . Learning disabilities Neg Hx   . Mental illness Neg Hx   . Mental retardation Neg Hx   . Miscarriages / Stillbirths Neg Hx   . Stroke Neg Hx   . Vision loss Neg Hx   . Varicose Veins Neg Hx     History  Substance Use Topics  . Smoking status: Never Smoker   . Smokeless tobacco: Not on file  . Alcohol Use: No    Allergies: No Known Allergies  No prescriptions prior to admission    Review of Systems   Constitutional: Negative for fever, chills and malaise/fatigue.  Gastrointestinal: Positive for abdominal pain. Negative for nausea, vomiting, diarrhea and constipation.  Genitourinary: Negative for dysuria.  Musculoskeletal: Positive for back pain.  Neurological: Negative for dizziness.   Physical Exam   Blood pressure 103/61, pulse 76, temperature 98.8 F (37.1 C), temperature source Oral, resp. rate 16, height 5' 1.5" (1.562 m), weight 61.871 kg (136 lb 6.4 oz), last menstrual period 09/13/2013, SpO2 98.00%.  Physical Exam  Constitutional: She is oriented to person, place, and time. She appears well-developed and well-nourished. No distress.  Cardiovascular: Normal rate.   Respiratory: Effort normal.  GI: Soft. She exhibits no distension. There is no tenderness. There is no rebound and no guarding.  Genitourinary: Uterus normal. No vaginal discharge found.  Grape sized soft mass on anterior vagina vs lower uterine segment. It is nontender, mobile. Irregular in shape. Feels like it is along urethra  Musculoskeletal: Normal range of motion.  Neurological: She is alert and oriented to person, place, and time.  Skin: Skin is warm and dry.  Psychiatric: She has a normal mood and affect.   FHTs audible by doppler Results for orders placed during the hospital encounter of 11/24/13 (from the past 72 hour(s))  URINALYSIS, ROUTINE W REFLEX MICROSCOPIC     Status: Abnormal   Collection Time    11/24/13  1:15 PM      Result Value Ref Range   Color, Urine YELLOW  YELLOW   APPearance CLEAR  CLEAR   Specific Gravity, Urine 1.020  1.005 - 1.030   pH 6.5  5.0 - 8.0   Glucose, UA NEGATIVE  NEGATIVE mg/dL   Hgb urine dipstick NEGATIVE  NEGATIVE   Bilirubin Urine NEGATIVE  NEGATIVE   Ketones, ur NEGATIVE  NEGATIVE mg/dL   Protein, ur NEGATIVE  NEGATIVE mg/dL   Urobilinogen, UA 0.2  0.0 - 1.0 mg/dL   Nitrite NEGATIVE  NEGATIVE   Leukocytes, UA MODERATE (*) NEGATIVE  URINE MICROSCOPIC-ADD ON      Status: Abnormal   Collection Time    11/24/13  1:15 PM      Result Value Ref Range   Squamous Epithelial / LPF FEW (*) RARE   WBC, UA 7-10  <3 WBC/hpf   Urine-Other TRICHOMONAS PRESENT     Comment: MUCOUS PRESENT     RARE YEAST  POCT PREGNANCY, URINE     Status: Abnormal   Collection Time    11/24/13  1:20 PM      Result Value Ref Range   Preg Test, Ur POSITIVE (*) NEGATIVE   Comment:            THE SENSITIVITY OF THIS     METHODOLOGY IS >24 mIU/mL   US done to try to identify anterior vaginal mass.    US Ob Transvaginal  11/24/2013   CLINICAL DATA:  Cramping.  Mass anterior vagina.  EXAM: OBSTETRIC <14 WK [place] AND TRANSVAGINAL OB US  TECHNIQUE: Both transabdominal and transvaginal ultrasound examinations were performed for complete evaluation of the gestation as well as the maternal uterus, adnexal regions, and pelvic cul-de-sac. Transvaginal technique was performed to assess early pregnancy.  COMPARISON:  None.    FINDINGS: Intrauterine gestational sac: Visualized/normal in shape.  Yolk sac:  Present.  Embryo:  Present.  Cardiac Activity: Present.  Heart Rate:  158 bpm  CRL:   3.48 cm  mm   10 w 3 d                    [place] EDC: 06/19/2014.  Maternal uterus/adnexae: No significant abnormality.  No free fluid.    IMPRESSION: Single viable intrauterine pregnancy 10 weeks 3 days. No mass lesion identified.     Electronically Signed   By: Maisie Fus  Register   On: 11/24/2013 14:41   MAU Course  Procedures  [facility]M [facility] Will need retreatment.   Assessment and Plan  A:  SIUP at [redacted]w[redacted]d       Unexplained abdominal pain      Anterior vaginal mass, unexplained      Trichomonas   P:  Treated here for Trich and Rx for Flagyl given for FOB        Appt at [facility] was for [place], so I called them to let them know I already did an [place].  [facility]  at 0930        Aviva Signs 11/24/2013, 1:38 PM

## 2013-11-24 NOTE — MAU Note (Signed)
Patient states she has had a positive pregnancy test at [facility] but does not go to them for care. States she has been having back and abdominal pain for "weeks" but got worse at 0130. Denies bleeding, discharge, vomiting. Has nausea all the time.

## 2013-12-01 ENCOUNTER — Other Ambulatory Visit: Payer: Self-pay

## 2013-12-09 ENCOUNTER — Encounter: Payer: Self-pay | Admitting: Women's Health

## 2013-12-09 ENCOUNTER — Ambulatory Visit (INDEPENDENT_AMBULATORY_CARE_PROVIDER_SITE_OTHER): Payer: BC Managed Care – PPO | Admitting: Women's Health

## 2013-12-09 ENCOUNTER — Other Ambulatory Visit (HOSPITAL_COMMUNITY)
Admission: RE | Admit: 2013-12-09 | Discharge: 2013-12-09 | Disposition: A | Discharge status: Home / Self Care | Payer: BC Managed Care – PPO | Source: Ambulatory Visit | Attending: Obstetrics and Gynecology | Admitting: Obstetrics and Gynecology

## 2013-12-09 VITALS — BP 100/62 | Wt 137.0 lb

## 2013-12-09 DIAGNOSIS — A5901 Trichomonal vulvovaginitis: Secondary | ICD-10-CM

## 2013-12-09 DIAGNOSIS — Z331 Pregnant state, incidental: Secondary | ICD-10-CM

## 2013-12-09 DIAGNOSIS — Z1389 Encounter for screening for other disorder: Secondary | ICD-10-CM

## 2013-12-09 DIAGNOSIS — O23591 Infection of other part of genital tract in pregnancy, first trimester: Secondary | ICD-10-CM

## 2013-12-09 DIAGNOSIS — Z01419 Encounter for gynecological examination (general) (routine) without abnormal findings: Secondary | ICD-10-CM | POA: Insufficient documentation

## 2013-12-09 DIAGNOSIS — Z113 Encounter for screening for infections with a predominantly sexual mode of transmission: Secondary | ICD-10-CM | POA: Insufficient documentation

## 2013-12-09 DIAGNOSIS — Z3400 Encounter for supervision of normal first pregnancy, unspecified trimester: Secondary | ICD-10-CM | POA: Insufficient documentation

## 2013-12-09 DIAGNOSIS — O2390 Unspecified genitourinary tract infection in pregnancy, unspecified trimester: Secondary | ICD-10-CM

## 2013-12-09 DIAGNOSIS — O210 Mild hyperemesis gravidarum: Secondary | ICD-10-CM

## 2013-12-09 LAB — CBC
HCT: 35.5 % — ABNORMAL LOW (ref 36.0–46.0)
Hemoglobin: 11 g/dL — ABNORMAL LOW (ref 12.0–15.0)
MCH: 21.4 pg — AB (ref 26.0–34.0)
MCHC: 31 g/dL (ref 30.0–36.0)
MCV: 69.1 fL — AB (ref 78.0–100.0)
PLATELETS: 255 10*3/uL (ref 150–400)
RBC: 5.14 MIL/uL — ABNORMAL HIGH (ref 3.87–5.11)
RDW: 15.3 % (ref 11.5–15.5)
WBC: 8.1 10*3/uL (ref 4.0–10.5)

## 2013-12-09 MED ORDER — DOXYLAMINE-PYRIDOXINE 10-10 MG PO TBEC: 10.0000 mg | DELAYED_RELEASE_TABLET | ORAL | Status: DC

## 2013-12-09 NOTE — Patient Instructions (Addendum)
Nausea & Vomiting  Have saltine crackers or pretzels by your bed and eat a few bites before you raise your head out of bed in the morning  Eat small frequent meals throughout the day instead of large meals  Drink plenty of fluids throughout the day to stay hydrated, just don't drink a lot of fluids with your meals.  This can make your stomach fill up faster making you feel sick  Do not brush your teeth right after you eat  Products with real ginger are good for nausea, like ginger ale and ginger hard candy Make sure it says made with real ginger!  Sucking on sour candy like lemon heads is also good for nausea  If your prenatal vitamins make you nauseated, take them at night so you will sleep through the nausea  If you feel like you need medicine for the nausea & vomiting please let us know  If you are unable to keep any fluids or food down please let us know    Pregnancy - First Trimester During sexual intercourse, millions of sperm go into the vagina. Only 1 sperm will penetrate and fertilize the female egg while it is in the Fallopian tube. One week later, the fertilized egg implants into the wall of the uterus. An embryo begins to develop into a baby. At 6 to 8 weeks, the eyes and face are formed and the heartbeat can be seen on ultrasound. At the end of 12 weeks (first trimester), all the baby's organs are formed. Now that you are pregnant, you will want to do everything you can to have a healthy baby. Two of the most important things are to get good prenatal care and follow your caregiver's instructions. Prenatal care is all the medical care you receive before the baby's birth. It is given to prevent, find, and treat problems during the pregnancy and childbirth. PRENATAL EXAMS  During prenatal visits, your weight, blood pressure, and urine are checked. This is done to make sure you are healthy and progressing normally during the pregnancy.  A pregnant woman should gain 25 to 35 pounds  during the pregnancy. However, if you are overweight or underweight, your caregiver will advise you regarding your weight.  Your caregiver will ask and answer questions for you.  Blood work, cervical cultures, other necessary tests, and a Pap test are done during your prenatal exams. These tests are done to check on your health and the probable health of your baby. Tests are strongly recommended and done for HIV with your permission. This is the virus that causes AIDS. These tests are done because medicines can be given to help prevent your baby from being born with this infection should you have been infected without knowing it. Blood work is also used to find out your blood type, previous infections, and follow your blood levels (hemoglobin).  Low hemoglobin (anemia) is common during pregnancy. Iron and vitamins are given to help prevent this. Later in the pregnancy, blood tests for diabetes will be done along with any other tests if any problems develop.  You may need other tests to make sure you and the baby are doing well. CHANGES DURING THE FIRST TRIMESTER  Your body goes through many changes during pregnancy. They vary from person to person. Talk to your caregiver about changes you notice and are concerned about. Changes can include:  Your menstrual period stops.  The egg and sperm carry the genes that determine what you look like. Genes from you   and your partner are forming a baby. The female genes determine whether the baby is a boy or a girl.  Your body increases in girth and you may feel bloated.  Feeling sick to your stomach (nauseous) and throwing up (vomiting). If the vomiting is uncontrollable, call your caregiver.  Your breasts will begin to enlarge and become tender.  Your nipples may stick out more and become darker.  The need to urinate more. Painful urination may mean you have a bladder infection.  Tiring easily.  Loss of appetite.  Cravings for certain kinds of  food.  At first, you may gain or lose a couple of pounds.  You may have changes in your emotions from day to day (excited to be pregnant or concerned something may go wrong with the pregnancy and baby).  You may have more vivid and strange dreams. HOME CARE INSTRUCTIONS   It is very important to avoid all smoking, alcohol and non-prescribed drugs during your pregnancy. These affect the formation and growth of the baby. Avoid chemicals while pregnant to ensure the delivery of a healthy infant.  Start your prenatal visits by the 12th week of pregnancy. They are usually scheduled monthly at first, then more often in the last 2 months before delivery. Keep your caregiver's appointments. Follow your caregiver's instructions regarding medicine use, blood and lab tests, exercise, and diet.  During pregnancy, you are providing food for you and your baby. Eat regular, well-balanced meals. Choose foods such as meat, fish, milk and other low fat dairy products, vegetables, fruits, and whole-grain breads and cereals. Your caregiver will tell you of the ideal weight gain.  You can help morning sickness by keeping soda crackers at the bedside. Eat a couple before arising in the morning. You may want to use the crackers without salt on them.  Eating 4 to 5 small meals rather than 3 large meals a day also may help the nausea and vomiting.  Drinking liquids between meals instead of during meals also seems to help nausea and vomiting.  A physical sexual relationship may be continued throughout pregnancy if there are no other problems. Problems may be early (premature) leaking of amniotic fluid from the membranes, vaginal bleeding, or belly (abdominal) pain.  Exercise regularly if there are no restrictions. Check with your caregiver or physical therapist if you are unsure of the safety of some of your exercises. Greater weight gain will occur in the last 2 trimesters of pregnancy. Exercising will  help:  Control your weight.  Keep you in shape.  Prepare you for labor and delivery.  Help you lose your pregnancy weight after you deliver your baby.  Wear a good support or jogging bra for breast tenderness during pregnancy. This may help if worn during sleep too.  Ask when prenatal classes are available. Begin classes when they are offered.  Do not use hot tubs, steam rooms, or saunas.  Wear your seat belt when driving. This protects you and your baby if you are in an accident.  Avoid raw meat, uncooked cheese, cat litter boxes, and soil used by cats throughout the pregnancy. These carry germs that can cause birth defects in the baby.  The first trimester is a good time to visit your dentist for your dental health. Getting your teeth cleaned is okay. Use a softer toothbrush and brush gently during pregnancy.  Ask for help if you have financial, counseling, or nutritional needs during pregnancy. Your caregiver will be able to offer counseling for   these needs as well as refer you for other special needs.  Do not take any medicines or herbs unless told by your caregiver.  Inform your caregiver if there is any mental or physical domestic violence.  Make a list of emergency phone numbers of family, friends, hospital, and police and fire departments.  Write down your questions. Take them to your prenatal visit.  Do not douche.  Do not cross your legs.  If you have to stand for long periods of time, rotate you feet or take small steps in a circle.  You may have more vaginal secretions that may require a sanitary pad. Do not use tampons or scented sanitary pads. MEDICINES AND DRUG USE IN PREGNANCY  Take prenatal vitamins as directed. The vitamin should contain 1 milligram of folic acid. Keep all vitamins out of reach of children. Only a couple vitamins or tablets containing iron may be fatal to a baby or young child when ingested.  Avoid use of all medicines, including herbs,  over-the-counter medicines, not prescribed or suggested by your caregiver. Only take over-the-counter or prescription medicines for pain, discomfort, or fever as directed by your caregiver. Do not use aspirin, ibuprofen, or naproxen unless directed by your caregiver.  Let your caregiver also know about herbs you may be using.  Alcohol is related to a number of birth defects. This includes fetal alcohol syndrome. All alcohol, in any form, should be avoided completely. Smoking will cause low birth rate and premature babies.  Street or illegal drugs are very harmful to the baby. They are absolutely forbidden. A baby born to an addicted mother will be addicted at birth. The baby will go through the same withdrawal an adult does.  Let your caregiver know about any medicines that you have to take and for what reason you take them. SEEK MEDICAL CARE IF:  You have any concerns or worries during your pregnancy. It is better to call with your questions if you feel they cannot wait, rather than worry about them. SEEK IMMEDIATE MEDICAL CARE IF:   An unexplained oral temperature above 102 F (38.9 C) develops, or as your caregiver suggests.  You have leaking of fluid from the vagina (birth canal). If leaking membranes are suspected, take your temperature and inform your caregiver of this when you call.  There is vaginal spotting or bleeding. Notify your caregiver of the amount and how many pads are used.  You develop a bad smelling vaginal discharge with a change in the color.  You continue to feel sick to your stomach (nauseated) and have no relief from remedies suggested. You vomit blood or coffee ground-like materials.  You lose more than 2 pounds of weight in 1 week.  You gain more than 2 pounds of weight in 1 week and you notice swelling of your face, hands, feet, or legs.  You gain 5 pounds or more in 1 week (even if you do not have swelling of your hands, face, legs, or feet).  You get  exposed to German measles and have never had them.  You are exposed to fifth disease or chickenpox.  You develop belly (abdominal) pain. Round ligament discomfort is a common non-cancerous (benign) cause of abdominal pain in pregnancy. Your caregiver still must evaluate this.  You develop headache, fever, diarrhea, pain with urination, or shortness of breath.  You fall or are in a car accident or have any kind of trauma.  There is mental or physical violence in your home. Document   Released: 06/06/2001 Document Revised: 03/06/2012 Document Reviewed: 12/08/2008 [facility]Exit[facility] Patient Information 2014 [place], [place].  Sciatica Sciatica is pain, weakness, numbness, or tingling along the path of the sciatic nerve. The nerve starts in the lower back and runs down the back of each leg. The nerve controls the muscles in the lower leg and in the back of the knee, while also providing sensation to the back of the thigh, lower leg, and the sole of your foot. Sciatica is a symptom of another medical condition. For instance, nerve damage or certain conditions, such as a herniated disk or bone spur on the spine, pinch or put pressure on the sciatic nerve. This causes the pain, weakness, or other sensations normally associated with sciatica. Generally, sciatica only affects one side of the body. CAUSES   Herniated or slipped disc.  Degenerative disk disease.  A pain disorder involving the narrow muscle in the buttocks (piriformis syndrome).  Pelvic injury or fracture.  Pregnancy.  Tumor (rare). SYMPTOMS  Symptoms can vary from mild to very severe. The symptoms usually travel from the low back to the buttocks and down the back of the leg. Symptoms can include:  Mild tingling or dull aches in the lower back, leg, or hip.  Numbness in the back of the calf or sole of the foot.  Burning sensations in the lower back, leg, or hip.  Sharp pains in the lower back, leg, or hip.  Leg weakness.  Severe  back pain inhibiting movement. These symptoms may get worse with coughing, sneezing, laughing, or prolonged sitting or standing. Also, being overweight may worsen symptoms. DIAGNOSIS  Your caregiver will perform a physical exam to look for common symptoms of sciatica. He or she may ask you to do certain movements or activities that would trigger sciatic nerve pain. Other tests may be performed to find the cause of the sciatica. These may include:  Blood tests.  X-rays.  Imaging tests, such as an MRI or CT scan. TREATMENT  Treatment is directed at the cause of the sciatic pain. Sometimes, treatment is not necessary and the pain and discomfort goes away on its own. If treatment is needed, your caregiver may suggest:  Over-the-counter medicines to relieve pain.  Prescription medicines, such as anti-inflammatory medicine, muscle relaxants, or narcotics.  Applying heat or ice to the painful area.  Steroid injections to lessen pain, irritation, and inflammation around the nerve.  Reducing activity during periods of pain.  Exercising and stretching to strengthen your abdomen and improve flexibility of your spine. Your caregiver may suggest losing weight if the extra weight makes the back pain worse.  Physical therapy.  Surgery to eliminate what is pressing or pinching the nerve, such as a bone spur or part of a herniated disk. HOME CARE INSTRUCTIONS   Only take over-the-counter or prescription medicines for pain or discomfort as directed by your caregiver.  Apply ice to the affected area for 20 minutes, 3 4 times a day for the first 48 72 hours. Then try heat in the same way.  Exercise, stretch, or perform your usual activities if these do not aggravate your pain.  Attend physical therapy sessions as directed by your caregiver.  Keep all follow-up appointments as directed by your caregiver.  Do not wear high heels or shoes that do not provide proper support.  Check your mattress to  see if it is too soft. A firm mattress may lessen your pain and discomfort. SEEK IMMEDIATE MEDICAL CARE IF:   You lose  control of your bowel or bladder (incontinence).  You have increasing weakness in the lower back, pelvis, buttocks, or legs.  You have redness or swelling of your back.  You have a burning sensation when you urinate.  You have pain that gets worse when you lie down or awakens you at night.  Your pain is worse than you have experienced in the past.  Your pain is lasting longer than 4 weeks.  You are suddenly losing weight without reason. MAKE SURE YOU:  Understand these instructions.  Will watch your condition.  Will get help right away if you are not doing well or get worse. Document Released: 06/06/2001 Document Revised: 12/12/2011 Document Reviewed: 10/22/2011 [facility]ExitCare Patient Information 2014 [place] HarborExitCare, [place]LL[place].

## 2013-12-09 NOTE — Progress Notes (Addendum)
Subjective:  Erica Jennings is a 23 y.o. G1P0 African American female at 3455w6d [facility] we do not have record [facility], being seen today for her first obstetrical visit.  Her obstetrical history is significant for primigravida, + trichomonas treated 6/1 @ [facility].  Was felt to have an anterior vaginal wall grape-sized mass on 6/1 visit to [facility], u/s was normal. Pregnancy history fully reviewed.  Patient reports nausea, cramping, Lt leg pain/numbness. Denies vomiting, vb, uti s/s, abnormal/malodorous vag d/c, or vulvovaginal itching/irritation.  BP 100/62  Wt 137 lb (62.143 kg)  LMP 09/09/2013  HISTORY: OB History  Gravida Para Term Preterm AB SAB TAB Ectopic Multiple Living  1             # Outcome Date GA Lbr Len/2nd Weight Sex Delivery Anes PTL Lv  1 CUR              Past Medical History  Diagnosis Date  . Anemia    Past Surgical History  Procedure Laterality Date  . No past surgeries     Family History  Problem Relation Age of Onset  . Alcohol abuse Neg Hx   . Arthritis Neg Hx   . Asthma Neg Hx   . Birth defects Neg Hx   . Cancer Neg Hx   . COPD Neg Hx   . Depression Neg Hx   . Diabetes Neg Hx   . Drug abuse Neg Hx   . Early death Neg Hx   . Hearing loss Neg Hx   . Heart disease Neg Hx   . Hyperlipidemia Neg Hx   . Hypertension Neg Hx   . Kidney disease Neg Hx   . Learning disabilities Neg Hx   . Mental illness Neg Hx   . Mental retardation Neg Hx   . Miscarriages / Stillbirths Neg Hx   . Stroke Neg Hx   . Vision loss Neg Hx   . Varicose Veins Neg Hx     Exam   System:     General: Well developed & nourished, no acute distress   Skin: Warm & dry, normal coloration and turgor, no rashes   Neurologic: Alert & oriented, normal mood   Cardiovascular: Regular rate & rhythm   Respiratory: Effort & rate normal, LCTAB, acyanotic   Abdomen: Soft, non tender   Extremities: normal strength, tone   Pelvic Exam:    Perineum: Normal perineum   Vulva: Normal, no lesions   Vagina:  Normal mucosa, thick curdy white d/c, no mass noted   Cervix: Normal, bulbous, appears closed, LTC   Uterus: Normal size/shape/contour for GA   Thin prep pap smear obtained w/ reflex high risk HPV cotesting FHR: 174 via doppler   Assessment:   Pregnancy: G1P0 There are no active problems to display for this patient.   9055w6d G1P0 New OB visit Recent trichomonas, treated 6/1 Nausea Lt sciatica Cramping   Plan:  Initial labs drawn Continue prenatal vitamins Problem list reviewed and updated Reviewed n/v relief measures and warning s/s to report Rx diclegis Reviewed recommended weight gain based on pre-gravid BMI Encouraged well-balanced diet Genetic Screening discussed Integrated Screen: requested Cystic fibrosis screening discussed requested Ultrasound discussed; fetal survey: requested Follow up in 1 week for 1st it/nt then 5wks from today for 2nd IT and visit [facility] completed NFPartnership offered, accepted, referral faxed [facility] @ next visit  Marge DuncansBooker, Kimberly Randall CNM, [facility] LLCWHNP-BC 12/09/2013 10:22 AM

## 2013-12-10 ENCOUNTER — Telehealth: Payer: Self-pay | Admitting: *Deleted

## 2013-12-10 ENCOUNTER — Other Ambulatory Visit: Payer: Self-pay | Admitting: Women's Health

## 2013-12-10 ENCOUNTER — Encounter: Payer: Self-pay | Admitting: Women's Health

## 2013-12-10 DIAGNOSIS — O2341 Unspecified infection of urinary tract in pregnancy, first trimester: Secondary | ICD-10-CM | POA: Insufficient documentation

## 2013-12-10 LAB — DRUG SCREEN, URINE, NO CONFIRMATION
Amphetamine Screen, Ur: NEGATIVE
BENZODIAZEPINES.: NEGATIVE
Barbiturate Quant, Ur: NEGATIVE
Cocaine Metabolites: NEGATIVE
Creatinine,U: 519.4 mg/dL
METHADONE: NEGATIVE
Marijuana Metabolite: NEGATIVE
Opiate Screen, Urine: NEGATIVE
Phencyclidine (PCP): NEGATIVE
Propoxyphene: NEGATIVE

## 2013-12-10 LAB — ABO AND RH: Rh Type: POSITIVE

## 2013-12-10 LAB — HIV ANTIBODY (ROUTINE TESTING W REFLEX): HIV 1&2 Ab, 4th Generation: NONREACTIVE

## 2013-12-10 LAB — URINALYSIS, ROUTINE W REFLEX MICROSCOPIC
Glucose, UA: NEGATIVE mg/dL
HGB URINE DIPSTICK: NEGATIVE
NITRITE: POSITIVE — AB
Protein, ur: 30 mg/dL — AB
Specific Gravity, Urine: 1.029 (ref 1.005–1.030)
Urobilinogen, UA: 1 mg/dL (ref 0.0–1.0)
pH: 6 (ref 5.0–8.0)

## 2013-12-10 LAB — CYTOLOGY - PAP

## 2013-12-10 LAB — CYSTIC FIBROSIS DIAGNOSTIC STUDY

## 2013-12-10 LAB — URINALYSIS, MICROSCOPIC ONLY
CASTS: NONE SEEN
Crystals: NONE SEEN

## 2013-12-10 LAB — VARICELLA ZOSTER ANTIBODY, IGG: Varicella IgG: 574.6 Index — ABNORMAL HIGH (ref <135.00)

## 2013-12-10 LAB — SICKLE CELL SCREEN: SICKLE CELL SCREEN: NEGATIVE

## 2013-12-10 LAB — OXYCODONE SCREEN, UA, RFLX CONFIRM: OXYCODONE SCRN UR: NEGATIVE ng/mL

## 2013-12-10 LAB — ANTIBODY SCREEN: Antibody Screen: NEGATIVE

## 2013-12-10 LAB — RUBELLA SCREEN: Rubella: 3.56 Index — ABNORMAL HIGH (ref <0.90)

## 2013-12-10 LAB — RPR

## 2013-12-10 LAB — HEPATITIS B SURFACE ANTIGEN: Hepatitis B Surface Ag: NEGATIVE

## 2013-12-10 MED ORDER — NITROFURANTOIN MONOHYD MACRO 100 MG PO CAPS: 100.0000 mg | ORAL_CAPSULE | Freq: Two times a day (BID) | ORAL | Status: DC

## 2013-12-10 NOTE — Telephone Encounter (Signed)
Message copied by Britta MccreedyEVANS, LEAH A on Wed Dec 10, 2013  8:54 AM ------      Message from: Shawna ClampBOOKER, KIMBERLY R      Created: Wed Dec 10, 2013  8:34 AM      Regarding: UTI       Do you mind calling Erica Jennings and letting her know she has a UTI and rx is at her pharmacy. She is also slightly anemic, so increase iron-rich foods: red meats, green leafy vegs, beans, etc, and make sure she's taking pnv.       Thanks! ------

## 2013-12-10 NOTE — Telephone Encounter (Signed)
Pt informed and verbalized understanding

## 2013-12-12 LAB — URINE CULTURE: Colony Count: >=100000

## 2013-12-16 ENCOUNTER — Encounter: Payer: Self-pay | Admitting: Women's Health

## 2013-12-17 ENCOUNTER — Other Ambulatory Visit: Payer: BC Managed Care – PPO

## 2013-12-22 ENCOUNTER — Other Ambulatory Visit: Payer: Self-pay | Admitting: Women's Health

## 2013-12-22 ENCOUNTER — Other Ambulatory Visit: Payer: BC Managed Care – PPO

## 2013-12-22 ENCOUNTER — Ambulatory Visit (INDEPENDENT_AMBULATORY_CARE_PROVIDER_SITE_OTHER): Payer: BC Managed Care – PPO

## 2013-12-22 DIAGNOSIS — Z3400 Encounter for supervision of normal first pregnancy, unspecified trimester: Secondary | ICD-10-CM

## 2013-12-22 DIAGNOSIS — Z3401 Encounter for supervision of normal first pregnancy, first trimester: Secondary | ICD-10-CM

## 2013-12-22 DIAGNOSIS — Z36 Encounter for antenatal screening of mother: Secondary | ICD-10-CM

## 2013-12-22 NOTE — Progress Notes (Signed)
U/S(13+5wks)-active fetus CRL c/w dates, cx appears closed (3.7cm), bilateral adnexa appears WNL, NB present, NT-1.6178mm, FHR- 167bpm, posterior Gr 0 placenta

## 2013-12-30 LAB — MATERNAL SCREEN, INTEGRATED #1

## 2014-01-08 ENCOUNTER — Inpatient Hospital Stay (HOSPITAL_COMMUNITY)
Admission: AD | Admit: 2014-01-08 | Discharge: 2014-01-08 | Disposition: A | Discharge status: Home / Self Care | Payer: BC Managed Care – PPO | Source: Ambulatory Visit | Attending: Obstetrics & Gynecology | Admitting: Obstetrics & Gynecology

## 2014-01-08 ENCOUNTER — Encounter (HOSPITAL_COMMUNITY): Payer: Self-pay | Admitting: *Deleted

## 2014-01-08 DIAGNOSIS — M545 Low back pain, unspecified: Secondary | ICD-10-CM | POA: Diagnosis not present

## 2014-01-08 DIAGNOSIS — O2341 Unspecified infection of urinary tract in pregnancy, first trimester: Secondary | ICD-10-CM

## 2014-01-08 DIAGNOSIS — N390 Urinary tract infection, site not specified: Secondary | ICD-10-CM | POA: Diagnosis not present

## 2014-01-08 DIAGNOSIS — B373 Candidiasis of vulva and vagina: Secondary | ICD-10-CM | POA: Diagnosis not present

## 2014-01-08 DIAGNOSIS — O2390 Unspecified genitourinary tract infection in pregnancy, unspecified trimester: Secondary | ICD-10-CM | POA: Insufficient documentation

## 2014-01-08 DIAGNOSIS — R109 Unspecified abdominal pain: Secondary | ICD-10-CM | POA: Diagnosis present

## 2014-01-08 DIAGNOSIS — B3731 Acute candidiasis of vulva and vagina: Secondary | ICD-10-CM | POA: Diagnosis not present

## 2014-01-08 LAB — CBC WITH DIFFERENTIAL/PLATELET
BASOS ABS: 0 10*3/uL (ref 0.0–0.1)
Band Neutrophils: 0 % (ref 0–10)
Basophils Relative: 0 % (ref 0–1)
Blasts: 0 %
EOS ABS: 0 10*3/uL (ref 0.0–0.7)
Eosinophils Relative: 0 % (ref 0–5)
HCT: 34 % — ABNORMAL LOW (ref 36.0–46.0)
HEMOGLOBIN: 11 g/dL — AB (ref 12.0–15.0)
LYMPHS ABS: 3.2 10*3/uL (ref 0.7–4.0)
Lymphocytes Relative: 36 % (ref 12–46)
MCH: 21.9 pg — ABNORMAL LOW (ref 26.0–34.0)
MCHC: 32.4 g/dL (ref 30.0–36.0)
MCV: 67.6 fL — ABNORMAL LOW (ref 78.0–100.0)
MONO ABS: 0.4 10*3/uL (ref 0.1–1.0)
MONOS PCT: 4 % (ref 3–12)
MYELOCYTES: 0 %
Metamyelocytes Relative: 0 %
NEUTROS ABS: 5.4 10*3/uL (ref 1.7–7.7)
NEUTROS PCT: 60 % (ref 43–77)
NRBC: 1 /100{WBCs} — AB
PLATELETS: 250 10*3/uL (ref 150–400)
Promyelocytes Absolute: 0 %
RBC: 5.03 MIL/uL (ref 3.87–5.11)
RDW: 14 % (ref 11.5–15.5)
WBC: 9 10*3/uL (ref 4.0–10.5)

## 2014-01-08 LAB — COMPREHENSIVE METABOLIC PANEL
ALBUMIN: 3.4 g/dL — AB (ref 3.5–5.2)
ALT: 6 U/L (ref 0–35)
AST: 13 U/L (ref 0–37)
Alkaline Phosphatase: 56 U/L (ref 39–117)
Anion gap: 12 (ref 5–15)
BUN: 7 mg/dL (ref 6–23)
CALCIUM: 9.3 mg/dL (ref 8.4–10.5)
CO2: 23 mEq/L (ref 19–32)
CREATININE: 0.59 mg/dL (ref 0.50–1.10)
Chloride: 100 mEq/L (ref 96–112)
GFR calc Af Amer: >90 mL/min (ref >90)
GFR calc non Af Amer: >90 mL/min (ref >90)
Glucose, Bld: 78 mg/dL (ref 70–99)
Potassium: 4 mEq/L (ref 3.7–5.3)
Sodium: 135 mEq/L — ABNORMAL LOW (ref 137–147)
TOTAL PROTEIN: 7 g/dL (ref 6.0–8.3)
Total Bilirubin: 0.4 mg/dL (ref 0.3–1.2)

## 2014-01-08 LAB — URINE MICROSCOPIC-ADD ON

## 2014-01-08 LAB — URINALYSIS, ROUTINE W REFLEX MICROSCOPIC
Bilirubin Urine: NEGATIVE
GLUCOSE, UA: NEGATIVE mg/dL
KETONES UR: 15 mg/dL — AB
LEUKOCYTES UA: NEGATIVE
Nitrite: POSITIVE — AB
PH: 6 (ref 5.0–8.0)
Protein, ur: 30 mg/dL — AB
Specific Gravity, Urine: 1.025 (ref 1.005–1.030)
Urobilinogen, UA: 1 mg/dL (ref 0.0–1.0)

## 2014-01-08 LAB — WET PREP, GENITAL
Clue Cells Wet Prep HPF POC: NONE SEEN
Trich, Wet Prep: NONE SEEN

## 2014-01-08 MED ORDER — FLUCONAZOLE 150 MG PO TABS: 150.0000 mg | ORAL_TABLET | Freq: Every day | ORAL | Status: DC

## 2014-01-08 MED ORDER — PHENAZOPYRIDINE HCL 200 MG PO TABS: 200.0000 mg | ORAL_TABLET | Freq: Three times a day (TID) | ORAL | Status: DC

## 2014-01-08 MED ORDER — HYDROMORPHONE HCL PF 1 MG/ML IJ SOLN
1.0000 mg | Freq: Once | INTRAMUSCULAR | Status: AC
  Administered 2014-01-08: 1 mg via INTRAMUSCULAR
  Filled 2014-01-08: qty 1

## 2014-01-08 MED ORDER — CEPHALEXIN 500 MG PO CAPS: 500.0000 mg | ORAL_CAPSULE | Freq: Four times a day (QID) | ORAL | Status: DC

## 2014-01-08 NOTE — [facility] Note (Signed)
PT  SAYS SHE FEELS CRAMPING-  STARTED   WED NIGHT AT 9PM. HAS BECOME WORSE TODAY.  [facility]-  WAS SEEN LAST ON 6-23.   NEXT APPOINTMENT -- 7-21.   SHE DID NOT TAKE ANYTHING  FOR CRAMPS-   9-   SHE TOOK TYLENOL AT 5 PM  YESTERDAY  FOR H/A.     SHE DID NOT CALL OFFICE.   LAST SEX-  6-16.    DENIES ANY PROBLEMS  WITH VOIDING.

## 2014-01-08 NOTE — Discharge Instructions (Signed)
Urinary Tract Infection A urinary tract infection (UTI) can occur any place along the urinary tract. The tract includes the kidneys, ureters, bladder, and urethra. A type of germ called bacteria often causes a UTI. UTIs are often helped with antibiotic medicine.  HOME CARE   If given, take antibiotics as told by your doctor. Finish them even if you start to feel better.  Drink enough fluids to keep your pee (urine) clear or pale yellow.  Avoid tea, drinks with caffeine, and bubbly (carbonated) drinks.  Pee often. Avoid holding your pee in for a long time.  Pee before and after having sex (intercourse).  Wipe from front to back after you poop (bowel movement) if you are a woman. Use each tissue only once. GET HELP RIGHT AWAY IF:   You have back pain.  You have lower belly (abdominal) pain.  You have chills.  You feel sick to your stomach (nauseous).  You throw up (vomit).  Your burning or discomfort with peeing does not go away.  You have a fever.  Your symptoms are not better in 3 days. MAKE SURE YOU:   Understand these instructions.  Will watch your condition.  Will get help right away if you are not doing well or get worse. Document Released: 11/29/2007 Document Revised: 03/06/2012 Document Reviewed: 01/11/2012 [facility] PlaceExitCare Patient Information 2015 [place]Care, [place]LL[place]. This information is not intended to replace advice given to you by your health care provider. Make sure you discuss any questions you have with your health care provider. Candidal Vulvovaginitis Candidal vulvovaginitis is an infection of the vagina and vulva. The vulva is the skin around the opening of the vagina. This may cause itching and discomfort in and around the vagina.  HOME CARE  Only take medicine as told by your doctor.  Do not have sex (intercourse) until the infection is healed or as told by your doctor.  Practice safe sex.  Tell your sex partner about your infection.  Do not douche or use  tampons.  Wear cotton underwear. Do not wear tight pants or panty hose.  Eat yogurt. This may help treat and prevent yeast infections. GET HELP RIGHT AWAY IF:   You have a fever.  Your problems get worse during treatment or do not get better in 3 days.  You have discomfort, irritation, or itching in your vagina or vulva area.  You have pain after sex.  You start to get belly (abdominal) pain. MAKE SURE YOU:  Understand these instructions.  Will watch your condition.  Will get help right away if you are not doing well or get worse. Document Released: 09/08/2008 Document Revised: 06/17/2013 Document Reviewed: 09/08/2008 [facility] CenterExit[facility] Patient Information 2015 [place]ExitCare, [place]LLC. This information is not intended to replace advice given to you by your health care provider. Make sure you discuss any questions you have with your health care provider.

## 2014-01-08 NOTE — MAU Provider Note (Signed)
History     CSN: [id number]  Arrival date and time: 01/08/14 4098   First Provider Initiated Contact with Patient 01/08/14 2116      No chief complaint on file.  HPI Ms Erica Jennings is a 23 y.o. G1P0 at [redacted]w[redacted]d who presents to MAU today with complaint of lower abdominal cramping since last night. She states she also has some sharp pains. She has felt the pains worsening throughout the day. She is having a thick, white-yellow vaginal discharge. She denies vaginal bleeding, flank pain or UTI symptoms. She also complains of lower back pain. Last sex was 1 month ago. Pain is rated at 9/10 now. She has not taken any pain medication.   OB History   Grav Para Term Preterm Abortions TAB SAB Ect Mult Living   1               Past Medical History  Diagnosis Date  . Anemia     Past Surgical History  Procedure Laterality Date  . No past surgeries      Family History  Problem Relation Age of Onset  . Alcohol abuse Neg Hx   . Arthritis Neg Hx   . Asthma Neg Hx   . Birth defects Neg Hx   . Cancer Neg Hx   . COPD Neg Hx   . Depression Neg Hx   . Diabetes Neg Hx   . Drug abuse Neg Hx   . Early death Neg Hx   . Hearing loss Neg Hx   . Heart disease Neg Hx   . Hyperlipidemia Neg Hx   . Hypertension Neg Hx   . Kidney disease Neg Hx   . Learning disabilities Neg Hx   . Mental illness Neg Hx   . Mental retardation Neg Hx   . Miscarriages / Stillbirths Neg Hx   . Stroke Neg Hx   . Vision loss Neg Hx   . Varicose Veins Neg Hx     History  Substance Use Topics  . Smoking status: Never Smoker   . Smokeless tobacco: Not on file  . Alcohol Use: No    Allergies: No Known Allergies  No prescriptions prior to admission    Review of Systems  Constitutional: Negative for fever and malaise/fatigue.  Gastrointestinal: Positive for nausea and abdominal pain. Negative for vomiting, diarrhea and constipation.  Genitourinary: Negative for dysuria, urgency and frequency.       Neg -  vaginal bleeding + vaginal discharge   Physical Exam   Blood pressure 100/50, pulse 89, temperature 99.2 F (37.3 C), temperature source Oral, resp. rate 18, height 5\' 1"  (1.549 m), weight 136 lb 2 oz (61.746 kg), last menstrual period 09/09/2013.  Physical Exam  Constitutional: She is oriented to person, place, and time. She appears well-developed and well-nourished. No distress.  HENT:  Head: Normocephalic.  Cardiovascular: Normal rate.   Respiratory: Effort normal.  GI: Soft. Bowel sounds are normal. She exhibits no distension and no mass. There is tenderness (mild to moderate diffuse tenderness to palpation of the abdomen worse in the lower abdomen). There is no rebound, no guarding and no CVA tenderness.  Genitourinary: Uterus is enlarged (appropriate for GA). Cervix exhibits no motion tenderness, no discharge and no friability. Right adnexum displays no mass and no tenderness. Left adnexum displays no mass and no tenderness. No bleeding around the vagina. Vaginal discharge (moderate amount of thick white discharge noted) found.  Neurological: She is alert and oriented to person, place, and  time.  Skin: Skin is warm and dry. No erythema.  Psychiatric: She has a normal mood and affect.  Cervix: closed, thick  Results for orders placed during the hospital encounter of 01/08/14 (from the past 24 hour(s))  CBC WITH DIFFERENTIAL     Status: Abnormal   Collection Time    01/08/14  9:28 PM      Result Value Ref Range   WBC 9.0  4.0 - 10.5 K/uL   RBC 5.03  3.87 - 5.11 MIL/uL   Hemoglobin 11.0 (*) 12.0 - 15.0 g/dL   HCT 16.134.0 (*) 09.636.0 - 04.546.0 %   MCV 67.6 (*) 78.0 - 100.0 fL   MCH 21.9 (*) 26.0 - 34.0 pg   MCHC 32.4  30.0 - 36.0 g/dL   RDW 40.914.0  81.111.5 - 91.415.5 %   Platelets 250  150 - 400 K/uL   Neutrophils Relative % 60  43 - 77 %   Lymphocytes Relative 36  12 - 46 %   Monocytes Relative 4  3 - 12 %   Eosinophils Relative 0  0 - 5 %   Basophils Relative 0  0 - 1 %   Band Neutrophils 0   0 - 10 %   Metamyelocytes Relative 0     Myelocytes 0     Promyelocytes Absolute 0     Blasts 0     nRBC 1 (*) 0 /100 WBC   Neutro Abs 5.4  1.7 - 7.7 K/uL   Lymphs Abs 3.2  0.7 - 4.0 K/uL   Monocytes Absolute 0.4  0.1 - 1.0 K/uL   Eosinophils Absolute 0.0  0.0 - 0.7 K/uL   Basophils Absolute 0.0  0.0 - 0.1 K/uL  COMPREHENSIVE METABOLIC PANEL     Status: Abnormal   Collection Time    01/08/14  9:28 PM      Result Value Ref Range   Sodium 135 (*) 137 - 147 mEq/L   Potassium 4.0  3.7 - 5.3 mEq/L   Chloride 100  96 - 112 mEq/L   CO2 23  19 - 32 mEq/L   Glucose, Bld 78  70 - 99 mg/dL   BUN 7  6 - 23 mg/dL   Creatinine, Ser 7.820.59  0.50 - 1.10 mg/dL   Calcium 9.3  8.4 - 95.610.5 mg/dL   Total Protein 7.0  6.0 - 8.3 g/dL   Albumin 3.4 (*) 3.5 - 5.2 g/dL   AST 13  0 - 37 U/L   ALT 6  0 - 35 U/L   Alkaline Phosphatase 56  39 - 117 U/L   Total Bilirubin 0.4  0.3 - 1.2 mg/dL   GFR calc non Af Amer >90  >90 mL/min   GFR calc Af Amer >90  >90 mL/min   Anion gap 12  5 - 15  WET PREP, GENITAL     Status: Abnormal   Collection Time    01/08/14  9:40 PM      Result Value Ref Range   Yeast Wet Prep HPF POC MANY (*) NONE SEEN   Trich, Wet Prep NONE SEEN  NONE SEEN   Clue Cells Wet Prep HPF POC NONE SEEN  NONE SEEN   WBC, Wet Prep HPF POC MODERATE (*) NONE SEEN  URINALYSIS, ROUTINE W REFLEX MICROSCOPIC     Status: Abnormal   Collection Time    01/08/14 10:13 PM      Result Value Ref Range   Color, Urine YELLOW  YELLOW  APPearance CLOUDY (*) CLEAR   Specific Gravity, Urine 1.025  1.005 - 1.030   pH 6.0  5.0 - 8.0   Glucose, UA NEGATIVE  NEGATIVE mg/dL   Hgb urine dipstick LARGE (*) NEGATIVE   Bilirubin Urine NEGATIVE  NEGATIVE   Ketones, ur 15 (*) NEGATIVE mg/dL   Protein, ur 30 (*) NEGATIVE mg/dL   Urobilinogen, UA 1.0  0.0 - 1.0 mg/dL   Nitrite POSITIVE (*) NEGATIVE   Leukocytes, UA NEGATIVE  NEGATIVE  URINE MICROSCOPIC-ADD ON     Status: Abnormal   Collection Time    01/08/14  10:13 PM      Result Value Ref Range   Squamous Epithelial / LPF FEW (*) RARE   WBC, UA 0-2  <3 WBC/hpf   RBC / HPF TOO NUMEROUS TO COUNT  <3 RBC/hpf   Bacteria, UA MANY (*) RARE    MAU Course  Procedures None  MDM +FHR - 171 bpm with doppler UA, wet prep and GC/Chlamydia today  Assessment and Plan  A: SIUP at [redacted]w[redacted]d UTI Yeast vulvovaginitis  P: Discharge home Rx for Keflex, Pyridium and Diflucan given to patient Warning signs for pyelonephritis discussed Patient advised to follow-up with [facility] as scheduled for routine prenatal care or sooner if symptoms worsen Patient may return to MAU as needed or if her condition were to change or worsen   Freddi Starr, PA-C  01/09/2014, 12:48 AM

## 2014-01-10 LAB — GC/CHLAMYDIA PROBE AMP
CT PROBE, AMP APTIMA: NEGATIVE
GC Probe RNA: NEGATIVE

## 2014-01-12 NOTE — MAU Provider Note (Signed)
Attestation of Attending Supervision of Advanced Practitioner (CNM/NP): Evaluation and management procedures were performed by the Advanced Practitioner under my supervision and collaboration. I have reviewed the Advanced Practitioner's note and chart, and I agree with the management and plan.  Caytlyn Evers H. 2:02 PM

## 2014-01-13 ENCOUNTER — Ambulatory Visit (INDEPENDENT_AMBULATORY_CARE_PROVIDER_SITE_OTHER): Payer: BC Managed Care – PPO | Admitting: Women's Health

## 2014-01-13 ENCOUNTER — Encounter: Payer: Self-pay | Admitting: Women's Health

## 2014-01-13 VITALS — BP 90/58 | Wt 136.0 lb

## 2014-01-13 DIAGNOSIS — Z3402 Encounter for supervision of normal first pregnancy, second trimester: Secondary | ICD-10-CM

## 2014-01-13 DIAGNOSIS — O23591 Infection of other part of genital tract in pregnancy, first trimester: Secondary | ICD-10-CM

## 2014-01-13 DIAGNOSIS — Z3400 Encounter for supervision of normal first pregnancy, unspecified trimester: Secondary | ICD-10-CM

## 2014-01-13 DIAGNOSIS — A5901 Trichomonal vulvovaginitis: Secondary | ICD-10-CM

## 2014-01-13 DIAGNOSIS — O2390 Unspecified genitourinary tract infection in pregnancy, unspecified trimester: Secondary | ICD-10-CM

## 2014-01-13 DIAGNOSIS — Z331 Pregnant state, incidental: Secondary | ICD-10-CM

## 2014-01-13 DIAGNOSIS — Z1389 Encounter for screening for other disorder: Secondary | ICD-10-CM

## 2014-01-13 LAB — POCT URINALYSIS DIPSTICK
Blood, UA: NEGATIVE
GLUCOSE UA: NEGATIVE
KETONES UA: NEGATIVE
Nitrite, UA: NEGATIVE

## 2014-01-13 NOTE — Progress Notes (Signed)
Low-risk OB appointment G1P0 3030w6d Estimated Date of Delivery: 06/24/14 BP 90/58  Wt 136 lb (61.689 kg)  LMP 09/09/2013  BP, weight, and urine reviewed.  Refer to obstetrical flow sheet for FH & FHR.  Good fm, more at night, keeps her up. Denies cramping, lof, vb, or uti s/s. [facility], dx w/ UTI & yeast infection, still taking keflex. Wet prep there was neg for trich, so no need for poc today.  Lots of stress at home, living w/ family, doesn't have own space- feels overwhelmed- wants medication for anxiety. No h/o anxiety/depression. Just feels she has a lot going on right now and not sleeping well.  Will try relaxation techniques and referral to [facility] for now. Will reevaluate at next visit. Discussed things to help her sleep better at night.  Reviewed warning s/s to report. Plan:  Continue routine obstetrical care  F/U in 4wks for OB appointment and anatomy u/s 2nd IT today

## 2014-01-13 NOTE — Addendum Note (Signed)
Addended by: Gaylyn RongEVANS, Jennifer Payes A on: 01/13/2014 10:45 AM   Modules accepted: Orders

## 2014-01-13 NOTE — Patient Instructions (Addendum)
Things to help you sleep:  * Try to get into the same routine every night, take warm bath before bed *Turn off tv, lights * Lavender scents: pillows, sprays, plug-ins * 1/2 of unisom or benadryl  Stress and Stress Management Stress is a normal reaction to life events. It is what you feel when life demands more than you are used to or more than you can handle. Some stress can be useful. For example, the stress reaction can help you catch the last bus of the day, study for a test, or meet a deadline at work. But stress that occurs too often or for too long can cause problems. It can affect your emotional health and interfere with relationships and normal daily activities. Too much stress can weaken your immune system and increase your risk for physical illness. If you already have a medical problem, stress can make it worse. CAUSES  All sorts of life events may cause stress. An event that causes stress for one person may not be stressful for another person. Major life events commonly cause stress. These may be positive or negative. Examples include losing your job, moving into a new home, getting married, having a baby, or losing a loved one. Less obvious life events may also cause stress, especially if they occur day after day or in combination. Examples include working long hours, driving in traffic, caring for children, being in debt, or being in a difficult relationship. SIGNS AND SYMPTOMS Stress may cause emotional symptoms including, the following:  Anxiety--This is feeling worried, afraid, on edge, overwhelmed, or out of control.  Anger--This is feeling irritated or impatient.  Depression--This is feeling sad, down, helpless, or guilty.  Difficulty focusing, remembering, or making decisions. Stress may cause physical symptoms, including the following:   Aches and pains--These may affect your head, neck, back, stomach, or other areas of your body.  Tight muscles or clenched jaw.  Low  energy or trouble sleeping. Stress may cause unhealthy behaviors, including the following:   Eating to feel better (overeating) or skipping meals.  Sleeping too little, too much, or both.  Working too much or putting off tasks (procrastination).  Smoking, drinking alcohol, or using drugs to feel better. DIAGNOSIS  Stress is diagnosed through an assessment by your health care provider. Your health care provider will ask questions about your symptoms and any stressful life events.Your health care provider will also ask about your medical history and may order blood tests or other tests. Certain medical conditions and medicine can cause physical symptoms similar to stress. Mental illness can cause emotional symptoms and unhealthy behaviors similar to stress. Your health care provider may refer you to a mental health professional for further evaluation.  TREATMENT  Stress management is the recommended treatment for stress.The goals of stress management are reducing stressful life events and coping with stress in healthy ways.  Techniques for reducing stressful life events include the following:  Stress identification--Self-monitor for stress and identify what causes stress for you. These skills may help you to avoid some stressful events.  Time management--Set your priorities, keep a calendar of events, and learn to say "no." These tools can help you avoid making too many commitments. Techniques for coping with stress include the following:  Rethinking the problem--Try to think realistically about stressful events rather than ignoring them or overreacting. Try to find the positives in a stressful situation rather than focusing on the negatives.  Exercise--Physical exercise can release both physical and emotional tension. The key  is to find a form of exercise you enjoy and do it regularly.  Relaxation techniques. These relax the body and mind. Examples include yoga, meditation, tai chi,  biofeedback, deep breathing, progressive muscle relaxation, listening to music, being out in nature, journaling, and other hobbies. Again, the key is to find one or more that you enjoy and can do regularly.  Healthy lifestyle--Eat a balanced diet, get plenty of sleep, and do not smoke. Avoid using alcohol or drugs to relax.  Strong support network--Spend time with family, friends, or other people you enjoy being around.Express your feelings and talk things over with someone you trust. Counseling or talktherapy with a mental health professional may be helpful if you are having difficulty managing stress on your own. Medicine is typically not recommended for the treatment of stress.Talk to your health care provider if you think you need medicine for symptoms of stress. HOME CARE INSTRUCTIONS:  Keep all follow up appointments with your health care provider.  Only take any prescribed medicines as directed by your health care provider.  Talk to your health care provider before starting any new prescription or over-the-counter medicines. SEEK MEDICAL CARE IF:  Your symptoms get worse or you start having new symptoms.  You feel overwhelmed by your problems and can no longer manage them on your own. SEEK IMMEDIATE MEDICAL CARE IF:  You feel like hurting yourself or someone else. Document Released: 12/06/2000 Document Revised: 06/17/2013 Document Reviewed: 02/04/2013 [facility] Patient Information 2015 [place], [place]. This information is not intended to replace advice given to you by your health care provider. Make sure you discuss any questions you have with your health care provider.  Generalized Anxiety Disorder Generalized anxiety disorder (GAD) is a mental disorder. It interferes with life functions, including relationships, work, and school. GAD is different from normal anxiety, which everyone experiences at some point in their lives in response to specific life events and activities.  Normal anxiety actually helps [place] prepare for and get through these life events and activities. Normal anxiety goes away after the event or activity is over.  GAD causes anxiety that is not necessarily related to specific events or activities. It also causes excess anxiety in proportion to specific events or activities. The anxiety associated with GAD is also difficult to control. GAD can vary from mild to severe. People with severe GAD can have intense waves of anxiety with physical symptoms (panic attacks).  SYMPTOMS The anxiety and worry associated with GAD are difficult to control. This anxiety and worry are related to many life events and activities and also occur more days than not for 6 months or longer. People with GAD also have three or more of the following symptoms (one or more in children):  Restlessness.   Fatigue.  Difficulty concentrating.   Irritability.  Muscle tension.  Difficulty sleeping or unsatisfying sleep. DIAGNOSIS GAD is diagnosed through an assessment by your caregiver. Your caregiver will ask you questions aboutyour mood,physical symptoms, and events in your life. Your caregiver may ask you about your medical history and use of alcohol or drugs, including prescription medications. Your caregiver may also do a physical exam and blood tests. Certain medical conditions and the use of certain substances can cause symptoms similar to those associated with GAD. Your caregiver may refer you to a mental health specialist for further evaluation. TREATMENT The following therapies are usually used to treat GAD:   Medication--Antidepressant medication usually is prescribed for long-term daily control. Antianxiety medications may be added in  severe cases, especially when panic attacks occur.   Talk therapy (psychotherapy)--Certain types of talk therapy can be helpful in treating GAD by providing support, education, and guidance. A form of talk therapy called cognitive  behavioral therapy can teach you healthy ways to think about and react to daily life events and activities.  Stress managementtechniques--These include yoga, meditation, and exercise and can be very helpful when they are practiced regularly. A mental health specialist can help determine which treatment is best for you. Some people see improvement with one therapy. However, other people require a combination of therapies. Document Released: 10/07/2012 Document Reviewed: 10/07/2012 [facility] Patient Information 2015 [place]. This information is not intended to replace advice given to you by your health care provider. Make sure you discuss any questions you have with your health care provider.  Second Trimester of Pregnancy The second trimester is from week 13 through week 28, months 4 through 6. The second trimester is often a time when you feel your best. Your body has also adjusted to being pregnant, and you begin to feel better physically. Usually, morning sickness has lessened or quit completely, you may have more energy, and you may have an increase in appetite. The second trimester is also a time when the fetus is growing rapidly. At the end of the sixth month, the fetus is about 9 inches long and weighs about 1 pounds. You will likely begin to feel the baby move (quickening) between 18 and 20 weeks of the pregnancy. BODY CHANGES Your body goes through many changes during pregnancy. The changes vary from woman to woman.   Your weight will continue to increase. You will notice your lower abdomen bulging out.  You may begin to get stretch marks on your hips, abdomen, and breasts.  You may develop headaches that can be relieved by medicines approved by your health care provider.  You may urinate more often because the fetus is pressing on your bladder.  You may develop or continue to have heartburn as a result of your pregnancy.  You may develop constipation because certain hormones  are causing the muscles that push waste through your intestines to slow down.  You may develop hemorrhoids or swollen, bulging veins (varicose veins).  You may have back pain because of the weight gain and pregnancy hormones relaxing your joints between the bones in your pelvis and as a result of a shift in weight and the muscles that support your balance.  Your breasts will continue to grow and be tender.  Your gums may bleed and may be sensitive to brushing and flossing.  Dark spots or blotches (chloasma, mask of pregnancy) may develop on your face. This will likely fade after the baby is born.  A dark line from your belly button to the pubic area (linea nigra) may appear. This will likely fade after the baby is born.  You may have changes in your hair. These can include thickening of your hair, rapid growth, and changes in texture. Some women also have hair loss during or after pregnancy, or hair that feels dry or thin. Your hair will most likely return to normal after your baby is born. WHAT TO EXPECT AT YOUR PRENATAL VISITS During a routine prenatal visit:  You will be weighed to make sure you and the fetus are growing normally.  Your blood pressure will be taken.  Your abdomen will be measured to track your baby's growth.  The fetal heartbeat will be listened to.  Any  test results from the previous visit will be discussed. Your health care provider may ask you:  How you are feeling.  If you are feeling the baby move.  If you have had any abnormal symptoms, such as leaking fluid, bleeding, severe headaches, or abdominal cramping.  If you have any questions. Other tests that may be performed during your second trimester include:  Blood tests that check for:  Low iron levels (anemia).  Gestational diabetes (between 24 and 28 weeks).  Rh antibodies.  Urine tests to check for infections, diabetes, or protein in the urine.  An ultrasound to confirm the proper growth  and development of the baby.  An amniocentesis to check for possible genetic problems.  Fetal screens for spina bifida and Down syndrome. HOME CARE INSTRUCTIONS   Avoid all smoking, herbs, alcohol, and unprescribed drugs. These chemicals affect the formation and growth of the baby.  Follow your health care provider's instructions regarding medicine use. There are medicines that are either safe or unsafe to take during pregnancy.  Exercise only as directed by your health care provider. Experiencing uterine cramps is a good sign to stop exercising.  Continue to eat regular, healthy meals.  Wear a good support bra for breast tenderness.  Do not use hot tubs, steam rooms, or saunas.  Wear your seat belt at all times when driving.  Avoid raw meat, uncooked cheese, cat litter boxes, and soil used by cats. These carry germs that can cause birth defects in the baby.  Take your prenatal vitamins.  Try taking a stool softener (if your health care provider approves) if you develop constipation. Eat more high-fiber foods, such as fresh vegetables or fruit and whole grains. Drink plenty of fluids to keep your urine clear or pale yellow.  Take warm sitz baths to soothe any pain or discomfort caused by hemorrhoids. Use hemorrhoid cream if your health care provider approves.  If you develop varicose veins, wear support hose. Elevate your feet for 15 minutes, 3-4 times a day. Limit salt in your diet.  Avoid heavy lifting, wear low heel shoes, and practice good posture.  Rest with your legs elevated if you have leg cramps or low back pain.  Visit your dentist if you have not gone yet during your pregnancy. Use a soft toothbrush to brush your teeth and be gentle when you floss.  A sexual relationship may be continued unless your health care provider directs you otherwise.  Continue to go to all your prenatal visits as directed by your health care provider. SEEK MEDICAL CARE IF:   You have  dizziness.  You have mild pelvic cramps, pelvic pressure, or nagging pain in the abdominal area.  You have persistent nausea, vomiting, or diarrhea.  You have a bad smelling vaginal discharge.  You have pain with urination. SEEK IMMEDIATE MEDICAL CARE IF:   You have a fever.  You are leaking fluid from your vagina.  You have spotting or bleeding from your vagina.  You have severe abdominal cramping or pain.  You have rapid weight gain or loss.  You have shortness of breath with chest pain.  You notice sudden or extreme swelling of your face, hands, ankles, feet, or legs.  You have not felt your baby move in over an hour.  You have severe headaches that do not go away with medicine.  You have vision changes. Document Released: 06/06/2001 Document Revised: 06/17/2013 Document Reviewed: 08/13/2012 [facility] Patient Information 2015 [place], [place]. This information is not intended  to replace advice given to you by your health care provider. Make sure you discuss any questions you have with your health care provider.

## 2014-01-15 ENCOUNTER — Telehealth: Payer: Self-pay | Admitting: Obstetrics & Gynecology

## 2014-01-15 NOTE — Telephone Encounter (Signed)
Pt states noticed light spotting once this am in the toilet but not on tissue when she wiped,  back pain. Pt informed to continue to monitor vaginal bleeding, take tylenol and push fluids for back pain, if vaginal bleeding noted to call our office back. Pt verbalized understanding.

## 2014-01-21 LAB — MATERNAL SCREEN, INTEGRATED #2
AFP MoM: 1.45
AFP, SERUM MAT SCREEN: 64.2 ng/mL
Calculated Gestational Age: 16.9
Crown Rump Length: 80.7 mm
Estriol Mom: 2.03
Estriol, Free: 2.15 ng/mL
HCG, MOM MAT SCREEN: 1.04
INHIBIN A DIMERIC MAT SCREEN: 118 pg/mL
INHIBIN A MOM MAT SCREEN: 0.69
MSS Down Syndrome: <1:5000 {titer}
MSS Trisomy 18 Risk: <1:5000 {titer}
NT MoM: 1.01
Nuchal Translucency: 1.78 mm
Number of fetuses: 1
PAPP-A MoM: 1
PAPP-A: 1683 ng/mL
Rish for ONTD: 1:3000 {titer}
hCG, Serum: 38.9 IU/mL

## 2014-01-27 ENCOUNTER — Encounter: Payer: Self-pay | Admitting: Women's Health

## 2014-02-10 ENCOUNTER — Encounter: Payer: BC Managed Care – PPO | Admitting: Women's Health

## 2014-02-10 ENCOUNTER — Other Ambulatory Visit: Payer: BC Managed Care – PPO

## 2014-03-11 ENCOUNTER — Inpatient Hospital Stay (HOSPITAL_COMMUNITY)
Admission: AD | Admit: 2014-03-11 | Discharge: 2014-03-12 | Disposition: A | Discharge status: Home / Self Care | Payer: BC Managed Care – PPO | Source: Ambulatory Visit | Attending: Obstetrics and Gynecology | Admitting: Obstetrics and Gynecology

## 2014-03-11 ENCOUNTER — Encounter (HOSPITAL_COMMUNITY): Payer: Self-pay | Admitting: *Deleted

## 2014-03-11 DIAGNOSIS — O212 Late vomiting of pregnancy: Secondary | ICD-10-CM | POA: Insufficient documentation

## 2014-03-11 DIAGNOSIS — E860 Dehydration: Secondary | ICD-10-CM | POA: Insufficient documentation

## 2014-03-11 DIAGNOSIS — R109 Unspecified abdominal pain: Secondary | ICD-10-CM | POA: Diagnosis present

## 2014-03-11 DIAGNOSIS — O2390 Unspecified genitourinary tract infection in pregnancy, unspecified trimester: Secondary | ICD-10-CM | POA: Insufficient documentation

## 2014-03-11 DIAGNOSIS — N390 Urinary tract infection, site not specified: Secondary | ICD-10-CM

## 2014-03-11 DIAGNOSIS — O219 Vomiting of pregnancy, unspecified: Secondary | ICD-10-CM

## 2014-03-11 NOTE — MAU Note (Signed)
Pt states she has been feeling "so crampy like I am having a menstrual cycle."

## 2014-03-12 DIAGNOSIS — O2390 Unspecified genitourinary tract infection in pregnancy, unspecified trimester: Secondary | ICD-10-CM | POA: Diagnosis not present

## 2014-03-12 LAB — URINALYSIS, ROUTINE W REFLEX MICROSCOPIC
Bilirubin Urine: NEGATIVE
GLUCOSE, UA: NEGATIVE mg/dL
Nitrite: NEGATIVE
PH: 6 (ref 5.0–8.0)
Protein, ur: NEGATIVE mg/dL
Specific Gravity, Urine: 1.015 (ref 1.005–1.030)
Urobilinogen, UA: 0.2 mg/dL (ref 0.0–1.0)

## 2014-03-12 LAB — WET PREP, GENITAL
CLUE CELLS WET PREP: NONE SEEN
TRICH WET PREP: NONE SEEN

## 2014-03-12 LAB — URINE MICROSCOPIC-ADD ON

## 2014-03-12 LAB — RAPID HIV SCREEN (WH-MAU): SUDS RAPID HIV SCREEN: NONREACTIVE

## 2014-03-12 MED ORDER — PROMETHAZINE HCL 25 MG/ML IJ SOLN
Freq: Once | INTRAMUSCULAR | Status: AC
  Administered 2014-03-12: 03:00:00 via INTRAVENOUS
  Filled 2014-03-12: qty 1000

## 2014-03-12 MED ORDER — PROMETHAZINE HCL 25 MG PO TABS: 25.0000 mg | ORAL_TABLET | Freq: Four times a day (QID) | ORAL | Status: DC | PRN

## 2014-03-12 MED ORDER — OXYCODONE HCL 5 MG PO TABS
5.0000 mg | ORAL_TABLET | Freq: Once | ORAL | Status: DC
  Filled 2014-03-12: qty 1

## 2014-03-12 MED ORDER — NITROFURANTOIN MONOHYD MACRO 100 MG PO CAPS
100.0000 mg | ORAL_CAPSULE | Freq: Once | ORAL | Status: AC
  Administered 2014-03-12: 100 mg via ORAL
  Filled 2014-03-12: qty 1

## 2014-03-12 MED ORDER — NITROFURANTOIN MONOHYD MACRO 100 MG PO CAPS: 100.0000 mg | ORAL_CAPSULE | Freq: Two times a day (BID) | ORAL | Status: DC

## 2014-03-12 MED ORDER — LACTATED RINGERS IV SOLN: INTRAVENOUS | Status: AC

## 2014-03-12 MED ORDER — OXYCODONE-ACETAMINOPHEN 5-325 MG PO TABS
1.0000 | ORAL_TABLET | Freq: Once | ORAL | Status: AC
  Administered 2014-03-12: 1 via ORAL
  Filled 2014-03-12: qty 1

## 2014-03-12 MED ORDER — CEPHALEXIN 500 MG PO CAPS
500.0000 mg | ORAL_CAPSULE | Freq: Once | ORAL | Status: DC
  Filled 2014-03-12: qty 1

## 2014-03-12 NOTE — MAU Provider Note (Signed)
History     CSN: [id number]  Arrival date and time: 03/11/14 2318   None     Chief Complaint  Patient presents with  . Abdominal Pain   HPI  This is a 23 yo G1P0 at GA 25.1 who presents to MAU for evaluation of lower pelvic cramping.States the cramping feels similar to cramping she experiences with a heavy menses. Onset of symptoms today. She has had 2 episodes of nausea today with emesis. Also complains of low back pain. No fevers, leakage of fluid, vaginal bleeding, vaginal discharge, or dysuria. Reports good fetal movement.  Past Medical History  Diagnosis Date  . Anemia     Past Surgical History  Procedure Laterality Date  . No past surgeries      Family History  Problem Relation Age of Onset  . Alcohol abuse Neg Hx   . Arthritis Neg Hx   . Asthma Neg Hx   . Birth defects Neg Hx   . Cancer Neg Hx   . COPD Neg Hx   . Depression Neg Hx   . Diabetes Neg Hx   . Drug abuse Neg Hx   . Early death Neg Hx   . Hearing loss Neg Hx   . Heart disease Neg Hx   . Hyperlipidemia Neg Hx   . Hypertension Neg Hx   . Kidney disease Neg Hx   . Learning disabilities Neg Hx   . Mental illness Neg Hx   . Mental retardation Neg Hx   . Miscarriages / Stillbirths Neg Hx   . Stroke Neg Hx   . Vision loss Neg Hx   . Varicose Veins Neg Hx     History  Substance Use Topics  . Smoking status: Never Smoker   . Smokeless tobacco: Not on file  . Alcohol Use: No    Allergies: No Known Allergies  Prescriptions prior to admission  Medication Sig Dispense Refill  . Prenatal Vit-Fe Fumarate-FA (PRENATAL MULTIVITAMIN) TABS tablet Take 1 tablet by mouth daily at 12 noon.        Review of Systems  Constitutional: Negative for fever.  Gastrointestinal: Positive for nausea, vomiting and abdominal pain.  Genitourinary: Negative for dysuria, urgency, frequency and hematuria.  Musculoskeletal: Positive for back pain.   Physical Exam   Blood pressure 119/67, temperature 98.4 F (36.9  C), temperature source Oral, resp. rate 16, last menstrual period 09/09/2013.  Physical Exam  Constitutional: She is oriented to person, place, and time. She appears well-nourished. No distress.  HENT:  Head: Atraumatic.  Eyes: EOM are normal.  Neck: Normal range of motion.  Cardiovascular: Normal rate, regular rhythm and normal heart sounds.   Respiratory: Effort normal and breath sounds normal. No respiratory distress. She has no wheezes. She has no rales. She exhibits no tenderness.  GI: Soft. Bowel sounds are normal. There is no tenderness.  Genitourinary: Uterus normal. Vaginal discharge found.  Musculoskeletal: She exhibits no edema.  Neurological: She is oriented to person, place, and time.  Skin: Skin is warm and dry.   FHTs: baseline HR 150s, moderate variability  TOCO: no contractions SVE: FT/30/-3 SSE: thick white discharge noted at introitus and posterior fornix, no bleeding or lesions Results for orders placed during the hospital encounter of 03/11/14 (from the past 24 hour(s))  URINALYSIS, ROUTINE W REFLEX MICROSCOPIC     Status: Abnormal   Collection Time    03/11/14 11:35 PM      Result Value Ref Range   Color, Urine YELLOW  YELLOW   APPearance CLEAR  CLEAR   Specific Gravity, Urine 1.015  1.005 - 1.030   pH 6.0  5.0 - 8.0   Glucose, UA NEGATIVE  NEGATIVE mg/dL   Hgb urine dipstick TRACE (*) NEGATIVE   Bilirubin Urine NEGATIVE  NEGATIVE   Ketones, ur >80 (*) NEGATIVE mg/dL   Protein, ur NEGATIVE  NEGATIVE mg/dL   Urobilinogen, UA 0.2  0.0 - 1.0 mg/dL   Nitrite NEGATIVE  NEGATIVE   Leukocytes, UA LARGE (*) NEGATIVE  URINE MICROSCOPIC-ADD ON     Status: Abnormal   Collection Time    03/11/14 11:35 PM      Result Value Ref Range   Squamous Epithelial / LPF FEW (*) RARE   WBC, UA TOO NUMEROUS TO COUNT  <3 WBC/hpf   RBC / HPF 7-10  <3 RBC/hpf   Bacteria, UA MANY (*) RARE  WET PREP, GENITAL     Status: Abnormal   Collection Time    03/12/14  1:10 AM       Result Value Ref Range   Yeast Wet Prep HPF POC FEW (*) NONE SEEN   Trich, Wet Prep NONE SEEN  NONE SEEN   Clue Cells Wet Prep HPF POC NONE SEEN  NONE SEEN   WBC, Wet Prep HPF POC FEW (*) NONE SEEN  RAPID HIV SCREEN ([facility])     Status: None   Collection Time    03/12/14  1:30 AM      Result Value Ref Range   SUDS Rapid HIV Screen NON REACTIVE  NON REACTIVE   .   MAU Course  Procedures   Wet prep, GC chlamydia, rapid HIV, and UA obtained and sent. Percocet X 1 for pain. Transfer care to MAU provider as pt now follows with [facility]. Start IV LR with phenergan 25 mg  Give first dose Keflex 500 mg po/ pt refused as she has had this in the past and it "did not work" Will give first dose Macrobid 100 mg Left message with Dr Dareen Piano reviewing case Assessment and Plan   A: UTI Dehydration Nausea in pregnancy  P: Above medications/ IVF were given Advised she can take tylenol as needed Phenergan 25 mg po prn nausea Macrobid 100 mg BID x 7 days Increase fluids/ rest Follow up with [facility] today Note for work today Return to MAU as needed   Carolynn Serve 03/12/2014, 7:55 AM

## 2014-03-12 NOTE — MAU Note (Signed)
Pt refusing Keflex po and would like a different antibiotic. Pt states that Keflex did not help previous UTI. L Barefoot to be made aware.

## 2014-03-12 NOTE — Discharge Instructions (Signed)
Dehydration, Adult °Dehydration is when you lose more fluids from the body than you take in. Vital organs like the kidneys, brain, and heart cannot function without a proper amount of fluids and salt. Any loss of fluids from the body can cause dehydration.  °CAUSES  °· Vomiting. °· Diarrhea. °· Excessive sweating. °· Excessive urine output. °· Fever. °SYMPTOMS  °Mild dehydration °· Thirst. °· Dry lips. °· Slightly dry mouth. °Moderate dehydration °· Very dry mouth. °· Sunken eyes. °· Skin does not bounce back quickly when lightly pinched and released. °· Dark urine and decreased urine production. °· Decreased tear production. °· Headache. °Severe dehydration °· Very dry mouth. °· Extreme thirst. °· Rapid, weak pulse (more than 100 beats per minute at rest). °· Cold hands and feet. °· Not able to sweat in spite of heat and temperature. °· Rapid breathing. °· Blue lips. °· Confusion and lethargy. °· Difficulty being awakened. °· Minimal urine production. °· No tears. °DIAGNOSIS  °Your caregiver will diagnose dehydration based on your symptoms and your exam. Blood and urine tests will help confirm the diagnosis. The diagnostic evaluation should also identify the cause of dehydration. °TREATMENT  °Treatment of mild or moderate dehydration can often be done at home by increasing the amount of fluids that you drink. It is best to drink small amounts of fluid more often. Drinking too much at one time can make vomiting worse. Refer to the home care instructions below. °Severe dehydration needs to be treated at the hospital where you will probably be given intravenous (IV) fluids that contain water and electrolytes. °HOME CARE INSTRUCTIONS  °· Ask your caregiver about specific rehydration instructions. °· Drink enough fluids to keep your urine clear or pale yellow. °· Drink small amounts frequently if you have nausea and vomiting. °· Eat as you normally do. °· Avoid: °¨ Foods or drinks high in sugar. °¨ Carbonated  drinks. °¨ Juice. °¨ Extremely hot or cold fluids. °¨ Drinks with caffeine. °¨ Fatty, greasy foods. °¨ Alcohol. °¨ Tobacco. °¨ Overeating. °¨ Gelatin desserts. °· Wash your hands well to avoid spreading bacteria and viruses. °· Only take over-the-counter or prescription medicines for pain, discomfort, or fever as directed by your caregiver. °· Ask your caregiver if you should continue all prescribed and over-the-counter medicines. °· Keep all follow-up appointments with your caregiver. °SEEK MEDICAL CARE IF: °· You have abdominal pain and it increases or stays in one area (localizes). °· You have a rash, stiff neck, or severe headache. °· You are irritable, sleepy, or difficult to awaken. °· You are weak, dizzy, or extremely thirsty. °SEEK IMMEDIATE MEDICAL CARE IF:  °· You are unable to keep fluids down or you get worse despite treatment. °· You have frequent episodes of vomiting or diarrhea. °· You have blood or green matter (bile) in your vomit. °· You have blood in your stool or your stool looks black and tarry. °· You have not urinated in 6 to 8 hours, or you have only urinated a small amount of very dark urine. °· You have a fever. °· You faint. °MAKE SURE YOU:  °· Understand these instructions. °· Will watch your condition. °· Will get help right away if you are not doing well or get worse. °Document Released: 06/12/2005 Document Revised: 09/04/2011 Document Reviewed: 01/30/2011 °Exit[employer]® Patient Information ©2015 [employer]. This information is not intended to replace advice given to you by your health care provider. Make sure you discuss any questions you have with your health care   provider. Hyperemesis Gravidarum Hyperemesis gravidarum is a severe form of nausea and vomiting that happens during pregnancy. Hyperemesis is worse than morning sickness. It may cause you to have nausea or vomiting all day for many days. It may keep you from eating and drinking enough food and liquids. Hyperemesis usually  occurs during the first half (the first 20 weeks) of pregnancy. It often goes away once a woman is in her second half of pregnancy. However, sometimes hyperemesis continues through an entire pregnancy.  CAUSES  The cause of this condition is not completely known but is thought to be related to changes in the body's hormones when pregnant. It could be from the high level of the pregnancy hormone or an increase in estrogen in the body.  SIGNS AND SYMPTOMS   Severe nausea and vomiting.  Nausea that does not go away.  Vomiting that does not allow you to keep any food down.  Weight loss and body fluid loss (dehydration).  Having no desire to eat or not liking food you have previously enjoyed. DIAGNOSIS  Your health care provider will do a physical exam and ask you about your symptoms. He or she may also order blood tests and urine tests to make sure something else is not causing the problem.  TREATMENT  You may only need medicine to control the problem. If medicines do not control the nausea and vomiting, you will be treated in the hospital to prevent dehydration, increased acid in the blood (acidosis), weight loss, and changes in the electrolytes in your body that may harm the unborn baby (fetus). You may need IV fluids.  HOME CARE INSTRUCTIONS   Only take over-the-counter or prescription medicines as directed by your health care provider.  Try eating a couple of dry crackers or toast in the morning before getting out of bed.  Avoid foods and smells that upset your stomach.  Avoid fatty and spicy foods.  Eat 5-6 small meals a day.  Do not drink when eating meals. Drink between meals.  For snacks, eat high-protein foods, such as cheese.  Eat or suck on things that have ginger in them. Ginger helps nausea.  Avoid food preparation. The smell of food can spoil your appetite.  Avoid iron pills and iron in your multivitamins until after 3-4 months of being pregnant. However, consult with  your health care provider before stopping any prescribed iron pills. SEEK MEDICAL CARE IF:   Your abdominal pain increases.  You have a severe headache.  You have vision problems.  You are losing weight. SEEK IMMEDIATE MEDICAL CARE IF:   You are unable to keep fluids down.  You vomit blood.  You have constant nausea and vomiting.  You have excessive weakness.  You have extreme thirst.  You have dizziness or fainting.  You have a fever or persistent symptoms for more than 2-3 days.  You have a fever and your symptoms suddenly get worse. MAKE SURE YOU:   Understand these instructions.  Will watch your condition.  Will get help right away if you are not doing well or get worse. Document Released: 06/12/2005 Document Revised: 04/02/2013 Document Reviewed: 01/22/2013 [facility] Patient Information 2015 [place], [place]. This information is not intended to replace advice given to you by your health care provider. Make sure you discuss any questions you have with your health care provider. Urinary Tract Infection Urinary tract infections (UTIs) can develop anywhere along your urinary tract. Your urinary tract is your body's drainage system for removing wastes  and extra water. Your urinary tract includes two kidneys, two ureters, a bladder, and a urethra. Your kidneys are a pair of bean-shaped organs. Each kidney is about the size of your fist. They are located below your ribs, one on each side of your spine. CAUSES Infections are caused by microbes, which are microscopic organisms, including fungi, viruses, and bacteria. These organisms are so small that they can only be seen through a microscope. Bacteria are the microbes that most commonly cause UTIs. SYMPTOMS  Symptoms of UTIs may vary by age and gender of the patient and by the location of the infection. Symptoms in young women typically include a frequent and intense urge to urinate and a painful, burning feeling in the bladder or  urethra during urination. Older women and men are more likely to be tired, shaky, and weak and have muscle aches and abdominal pain. A fever may mean the infection is in your kidneys. Other symptoms of a kidney infection include pain in your back or sides below the ribs, nausea, and vomiting. DIAGNOSIS To diagnose a UTI, your caregiver will ask you about your symptoms. Your caregiver also will ask to provide a urine sample. The urine sample will be tested for bacteria and white blood cells. White blood cells are made by your body to help fight infection. TREATMENT  Typically, UTIs can be treated with medication. Because most UTIs are caused by a bacterial infection, they usually can be treated with the use of antibiotics. The choice of antibiotic and length of treatment depend on your symptoms and the type of bacteria causing your infection. HOME CARE INSTRUCTIONS  If you were prescribed antibiotics, take them exactly as your caregiver instructs you. Finish the medication even if you feel better after you have only taken some of the medication.  Drink enough water and fluids to keep your urine clear or pale yellow.  Avoid caffeine, tea, and carbonated beverages. They tend to irritate your bladder.  Empty your bladder often. Avoid holding urine for long periods of time.  Empty your bladder before and after sexual intercourse.  After a bowel movement, women should cleanse from front to back. Use each tissue only once. SEEK MEDICAL CARE IF:   You have back pain.  You develop a fever.  Your symptoms do not begin to resolve within 3 days. SEEK IMMEDIATE MEDICAL CARE IF:   You have severe back pain or lower abdominal pain.  You develop chills.  You have nausea or vomiting.  You have continued burning or discomfort with urination. MAKE SURE YOU:   Understand these instructions.  Will watch your condition.  Will get help right away if you are not doing well or get worse. Document  Released: 03/22/2005 Document Revised: 12/12/2011 Document Reviewed: 07/21/2011 [facility] Patient Information 2015 [place], [place]. This information is not intended to replace advice given to you by your health care provider. Make sure you discuss any questions you have with your health care provider.

## 2014-03-13 LAB — GC/CHLAMYDIA PROBE AMP
CT PROBE, AMP APTIMA: NEGATIVE
GC Probe RNA: NEGATIVE

## 2014-03-14 LAB — CULTURE, OB URINE: Colony Count: >=100000

## 2014-04-23 ENCOUNTER — Inpatient Hospital Stay (HOSPITAL_COMMUNITY)
Admission: AD | Admit: 2014-04-23 | Discharge: 2014-04-24 | Disposition: A | Discharge status: Home / Self Care | Payer: BC Managed Care – PPO | Source: Ambulatory Visit | Attending: Obstetrics and Gynecology | Admitting: Obstetrics and Gynecology

## 2014-04-23 ENCOUNTER — Encounter (HOSPITAL_COMMUNITY): Payer: Self-pay | Admitting: *Deleted

## 2014-04-23 DIAGNOSIS — Z3A31 31 weeks gestation of pregnancy: Secondary | ICD-10-CM | POA: Diagnosis not present

## 2014-04-23 DIAGNOSIS — B3731 Acute candidiasis of vulva and vagina: Secondary | ICD-10-CM

## 2014-04-23 DIAGNOSIS — N9089 Other specified noninflammatory disorders of vulva and perineum: Secondary | ICD-10-CM | POA: Diagnosis not present

## 2014-04-23 DIAGNOSIS — O98813 Other maternal infectious and parasitic diseases complicating pregnancy, third trimester: Secondary | ICD-10-CM | POA: Diagnosis present

## 2014-04-23 DIAGNOSIS — B373 Candidiasis of vulva and vagina: Secondary | ICD-10-CM | POA: Diagnosis not present

## 2014-04-23 LAB — OB RESULTS CONSOLE GC/CHLAMYDIA
Chlamydia: NEGATIVE
GC PROBE AMP, GENITAL: NEGATIVE

## 2014-04-23 LAB — AMNISURE RUPTURE OF MEMBRANE (ROM) NOT AT ARMC: AMNISURE: NEGATIVE

## 2014-04-23 LAB — WET PREP, GENITAL
Clue Cells Wet Prep HPF POC: NONE SEEN
Trich, Wet Prep: NONE SEEN

## 2014-04-23 MED ORDER — FLUCONAZOLE 150 MG PO TABS: ORAL_TABLET | ORAL | Status: DC

## 2014-04-23 MED ORDER — FLUCONAZOLE 150 MG PO TABS
150.0000 mg | ORAL_TABLET | ORAL | Status: AC
  Administered 2014-04-23: 150 mg via ORAL
  Filled 2014-04-23: qty 1

## 2014-04-23 MED ORDER — NYSTATIN-TRIAMCINOLONE 100000-0.1 UNIT/GM-% EX OINT: 1.0000 "application " | TOPICAL_OINTMENT | Freq: Two times a day (BID) | CUTANEOUS | Status: DC

## 2014-04-23 NOTE — Progress Notes (Signed)
Pt states she saw specks of blood

## 2014-04-23 NOTE — MAU Note (Signed)
PT  SAYS SHE HAS VAG D/C - NOTICED  AT 7 PM--  WENT TO B-ROOM     THICK YELLOW.    SAW LEAKING FLUID-IN     BACK  TO B-ROOM AT 7 15-    SAYS SHE STILL  FEELS FLUID LEAKING.    SAYS HER VAG  LOOKS AND FEELS SWOLLEN -  NOTICED AT 715.        WAS IN OFFICE   ON 10-14  -   NL   CARE-  ALL OK.  NEXT APPOINTMENT IS   11-3 ... LAST SEX-  9-29.

## 2014-04-23 NOTE — MAU Provider Note (Signed)
Chief Complaint: Rupture of Membranes   First Provider Initiated Contact with Patient 04/23/14 2309     SUBJECTIVE HPI: Erica Jennings is a 23 y.o. G1P0 at 5813w1d by LMP who presents to maternity admissions reporting vaginal discharge, enough to soak her underwear and get onto her pants x 1 episode, described as clear and thin.  She did note scant pink when wiping after this episode.  She does report frequent yeast infections and has had some vaginal itching x 2-3 days.  She also indicates labial swelling starting today which is uncomfortable.  She reports good fetal movement, denies cramping/contractins, vaginal bleeding, urinary symptoms, h/a, dizziness, n/v, or fever/chills.     Past Medical History  Diagnosis Date  . Anemia    Past Surgical History  Procedure Laterality Date  . No past surgeries     History   Social History  . Marital Status: Single    Spouse Name: N/A    Number of Children: N/A  . Years of Education: N/A   Occupational History  . Not on file.   Social History Main Topics  . Smoking status: Never Smoker   . Smokeless tobacco: Not on file  . Alcohol Use: No  . Drug Use: No  . Sexual Activity: Yes   Other Topics Concern  . Not on file   Social History Narrative  . No narrative on file   No current facility-administered medications on file prior to encounter.   Current Outpatient Prescriptions on File Prior to Encounter  Medication Sig Dispense Refill  . nitrofurantoin, macrocrystal-monohydrate, (MACROBID) 100 MG capsule Take 1 capsule (100 mg total) by mouth 2 (two) times daily.  14 capsule  0  . Prenatal Vit-Fe Fumarate-FA (PRENATAL MULTIVITAMIN) TABS tablet Take 1 tablet by mouth daily at 12 noon.      . promethazine (PHENERGAN) 25 MG tablet Take 1 tablet (25 mg total) by mouth every 6 (six) hours as needed for nausea or vomiting.  30 tablet  0   No Known Allergies  ROS: Pertinent items in HPI  OBJECTIVE Blood pressure 100/59, pulse 92,  temperature 98.7 F (37.1 C), temperature source Oral, resp. rate 20, height 5\' 1"  (1.549 m), weight 65.772 kg (145 lb), last menstrual period 09/09/2013. GENERAL: Well-developed, well-nourished female in no acute distress.  HEENT: Normocephalic HEART: normal rate RESP: normal effort ABDOMEN: Soft, non-tender EXTREMITIES: Nontender, no edema NEURO: Alert and oriented Pelvic exam: Cervix pink, visually closed, without lesion,negative pooling, large amount thick white discharge clinging to cervix and vaginal walls, vaginal walls normal, significant swelling of right and left labia, skin shiny and taut  Cervix 1/long/high, firm, anterior  FHR baseline 135 with moderate variability, positive accels, no decels Toco with some intermittent irritability, mild to palpation  LAB RESULTS Results for orders placed during the hospital encounter of 04/23/14 (from the past 24 hour(s))  WET PREP, GENITAL     Status: Abnormal   Collection Time    04/23/14 11:00 PM      Result Value Ref Range   Yeast Wet Prep HPF POC MODERATE (*) NONE SEEN   Trich, Wet Prep NONE SEEN  NONE SEEN   Clue Cells Wet Prep HPF POC NONE SEEN  NONE SEEN   WBC, Wet Prep HPF POC MODERATE (*) NONE SEEN  AMNISURE RUPTURE OF MEMBRANE (ROM)     Status: None   Collection Time    04/23/14 11:00 PM      Result Value Ref Range   Amnisure ROM NEGATIVE  ASSESSMENT 1. Vagina, candidiasis   2. Edema of vulva     PLAN Diflucan 150 mg dose given in MAU Discharge home Second dose of Diflucan 150 mg sent to pharmacy.  Also, topical Mycolog BID PRN for comfort. Use ice packs 2-3x/day for comfort   Follow-up Information   Follow up with Levi AlandANDERSON,MARK E, MD. (As scheduled)    Specialty:  Obstetrics and Gynecology   Contact information:   [address] [place] [id number] [phone]       [facility]. (As needed for emergencies)    Contact  information:   [address] [id number] [place] [id number] [phone]      Sharen CounterLisa Leftwich-Kirby Certified Nurse-Midwife 04/24/2014  12:01 AM

## 2014-04-23 NOTE — MAU Note (Signed)
Pt states she went to the bathroom at work wiped  Noticed discharge. Pt states she noticed pasted white discharge and noted swelling of vaginal "lips"  Pt states she feels itchy and swollen. Pt pants are wet pt states she does not know her pants go wet.

## 2014-04-24 DIAGNOSIS — O98813 Other maternal infectious and parasitic diseases complicating pregnancy, third trimester: Secondary | ICD-10-CM | POA: Diagnosis not present

## 2014-04-24 LAB — GC/CHLAMYDIA PROBE AMP
CT PROBE, AMP APTIMA: NEGATIVE
GC Probe RNA: NEGATIVE

## 2014-04-24 NOTE — Discharge Instructions (Signed)
Apply ice pack to swollen area 2-3 x/day for comfort.   Yeast Vaginitis Vaginitis in a soreness, swelling and redness (inflammation) of the vagina and vulva. Monilial vaginitis is not a sexually transmitted infection. CAUSES  Yeast vaginitis is caused by yeast (candida) that is normally found in your vagina. With a yeast infection, the candida has overgrown in number to a point that upsets the chemical balance. SYMPTOMS   White, thick vaginal discharge.  Swelling, itching, redness and irritation of the vagina and possibly the lips of the vagina (vulva).  Burning or painful urination.  Painful intercourse. DIAGNOSIS  Things that may contribute to monilial vaginitis are:  Postmenopausal and virginal states.  Pregnancy.  Infections.  Being tired, sick or stressed, especially if you had monilial vaginitis in the past.  Diabetes. Good control will help lower the chance.  Birth control pills.  Tight fitting garments.  Using bubble bath, feminine sprays, douches or deodorant tampons.  Taking certain medications that kill germs (antibiotics).  Sporadic recurrence can occur if you become ill. TREATMENT  Your caregiver will give you medication.  There are several kinds of anti monilial vaginal creams and suppositories specific for monilial vaginitis. For recurrent yeast infections, use a suppository or cream in the vagina 2 times a week, or as directed.  Anti-monilial or steroid cream for the itching or irritation of the vulva may also be used. Get your caregiver's permission.  Painting the vagina with methylene blue solution may help if the monilial cream does not work.  Eating yogurt may help prevent monilial vaginitis. HOME CARE INSTRUCTIONS   Finish all medication as prescribed.  Do not have sex until treatment is completed or after your caregiver tells you it is okay.  Take warm sitz baths.  Do not douche.  Do not use tampons, especially scented ones.  Wear  cotton underwear.  Avoid tight pants and panty hose.  Tell your sexual partner that you have a yeast infection. They should go to their caregiver if they have symptoms such as mild rash or itching.  Your sexual partner should be treated as well if your infection is difficult to eliminate.  Practice safer sex. Use condoms.  Some vaginal medications cause latex condoms to fail. Vaginal medications that harm condoms are:  Cleocin cream.  Butoconazole (Femstat).  Terconazole (Terazol) vaginal suppository.  Miconazole (Monistat) (may be purchased over the counter). SEEK MEDICAL CARE IF:   You have a temperature by mouth above 102 F (38.9 C).  The infection is getting worse after 2 days of treatment.  The infection is not getting better after 3 days of treatment.  You develop blisters in or around your vagina.  You develop vaginal bleeding, and it is not your menstrual period.  You have pain when you urinate.  You develop intestinal problems.  You have pain with sexual intercourse. Document Released: 03/22/2005 Document Revised: 09/04/2011 Document Reviewed: 12/04/2008 [facility] Patient Information 2015 [place], [place]. This information is not intended to replace advice given to you by your health care provider. Make sure you discuss any questions you have with your health care provider. Edema Edema is an abnormal buildup of fluids in your bodytissues. Edema is somewhatdependent on gravity to pull the fluid to the lowest place in your body. That makes the condition more common in the legs and thighs (lower extremities). Painless swelling of the feet and ankles is common and becomes more likely as you get older. It is also common in looser tissues, like around your  eyes.  When the affected area is squeezed, the fluid may move out of that spot and leave a dent for a few moments. This dent is called pitting.  CAUSES  There are many possible causes of edema. Eating too much salt  and being on your feet or sitting for a long time can cause edema in your legs and ankles. Hot weather may make edema worse. Common medical causes of edema include:  Heart failure.  Liver disease.  Kidney disease.  Weak blood vessels in your legs.  Cancer.  An injury.  Pregnancy.  Some medications.  Obesity. SYMPTOMS  Edema is usually painless.Your skin may look swollen or shiny.  DIAGNOSIS  Your health care provider may be able to diagnose edema by asking about your medical history and doing a physical exam. You may need to have tests such as X-rays, an electrocardiogram, or blood tests to check for medical conditions that may cause edema.  TREATMENT  Edema treatment depends on the cause. If you have heart, liver, or kidney disease, you need the treatment appropriate for these conditions. General treatment may include:  Elevation of the affected body part above the level of your heart.  Compression of the affected body part. Pressure from elastic bandages or support stockings squeezes the tissues and forces fluid back into the blood vessels. This keeps fluid from entering the tissues.  Restriction of fluid and salt intake.  Use of a water pill (diuretic). These medications are appropriate only for some types of edema. They pull fluid out of your body and make you urinate more often. This gets rid of fluid and reduces swelling, but diuretics can have side effects. Only use diuretics as directed by your health care provider. HOME CARE INSTRUCTIONS   Keep the affected body part above the level of your heart when you are lying down.   Do not sit still or stand for prolonged periods.   Do not put anything directly under your knees when lying down.  Do not wear constricting clothing or garters on your upper legs.   Exercise your legs to work the fluid back into your blood vessels. This may help the swelling go down.   Wear elastic bandages or support stockings to reduce  ankle swelling as directed by your health care provider.   Eat a low-salt diet to reduce fluid if your health care provider recommends it.   Only take medicines as directed by your health care provider. SEEK MEDICAL CARE IF:   Your edema is not responding to treatment.  You have heart, liver, or kidney disease and notice symptoms of edema.  You have edema in your legs that does not improve after elevating them.   You have sudden and unexplained weight gain. SEEK IMMEDIATE MEDICAL CARE IF:   You develop shortness of breath or chest pain.   You cannot breathe when you lie down.  You develop pain, redness, or warmth in the swollen areas.   You have heart, liver, or kidney disease and suddenly get edema.  You have a fever and your symptoms suddenly get worse. MAKE SURE YOU:   Understand these instructions.  Will watch your condition.  Will get help right away if you are not doing well or get worse. Document Released: 06/12/2005 Document Revised: 10/27/2013 Document Reviewed: 04/04/2013 [facility] Patient Information 2015 [place], [place]. This information is not intended to replace advice given to you by your health care provider. Make sure you discuss any questions you have with your  health care provider. ° °

## 2014-04-27 ENCOUNTER — Encounter (HOSPITAL_COMMUNITY): Payer: Self-pay | Admitting: *Deleted

## 2014-05-19 ENCOUNTER — Other Ambulatory Visit: Payer: Self-pay | Admitting: Obstetrics and Gynecology

## 2014-05-19 LAB — OB RESULTS CONSOLE GBS: GBS: POSITIVE

## 2014-06-10 ENCOUNTER — Inpatient Hospital Stay (HOSPITAL_COMMUNITY): Payer: BC Managed Care – PPO | Admitting: Anesthesiology

## 2014-06-10 ENCOUNTER — Encounter (HOSPITAL_COMMUNITY): Payer: Self-pay | Admitting: *Deleted

## 2014-06-10 ENCOUNTER — Inpatient Hospital Stay (HOSPITAL_COMMUNITY)
Admission: AD | Admit: 2014-06-10 | Discharge: 2014-06-12 | DRG: 775 | Disposition: A | Discharge status: Home / Self Care | Payer: BC Managed Care – PPO | Source: Ambulatory Visit | Attending: Obstetrics and Gynecology | Admitting: Obstetrics and Gynecology

## 2014-06-10 DIAGNOSIS — A5901 Trichomonal vulvovaginitis: Secondary | ICD-10-CM

## 2014-06-10 DIAGNOSIS — O2341 Unspecified infection of urinary tract in pregnancy, first trimester: Secondary | ICD-10-CM

## 2014-06-10 DIAGNOSIS — O23591 Infection of other part of genital tract in pregnancy, first trimester: Secondary | ICD-10-CM

## 2014-06-10 DIAGNOSIS — Z3A38 38 weeks gestation of pregnancy: Secondary | ICD-10-CM | POA: Diagnosis present

## 2014-06-10 DIAGNOSIS — Z3403 Encounter for supervision of normal first pregnancy, third trimester: Secondary | ICD-10-CM

## 2014-06-10 LAB — CBC
HEMATOCRIT: 29.1 % — AB (ref 36.0–46.0)
HEMOGLOBIN: 8.9 g/dL — AB (ref 12.0–15.0)
MCH: 18.1 pg — ABNORMAL LOW (ref 26.0–34.0)
MCHC: 30.6 g/dL (ref 30.0–36.0)
MCV: 59.1 fL — AB (ref 78.0–100.0)
Platelets: 225 10*3/uL (ref 150–400)
RBC: 4.92 MIL/uL (ref 3.87–5.11)
RDW: 17.9 % — AB (ref 11.5–15.5)
WBC: 9.6 10*3/uL (ref 4.0–10.5)

## 2014-06-10 LAB — TYPE AND SCREEN
ABO/RH(D): O POS
Antibody Screen: NEGATIVE

## 2014-06-10 LAB — HIV ANTIBODY (ROUTINE TESTING W REFLEX): HIV: NONREACTIVE

## 2014-06-10 LAB — ABO/RH: ABO/RH(D): O POS

## 2014-06-10 LAB — POCT FERN TEST: POCT FERN TEST: POSITIVE

## 2014-06-10 LAB — RPR

## 2014-06-10 MED ORDER — ONDANSETRON HCL 4 MG PO TABS: 4.0000 mg | ORAL_TABLET | ORAL | Status: DC | PRN

## 2014-06-10 MED ORDER — IBUPROFEN 600 MG PO TABS
600.0000 mg | ORAL_TABLET | Freq: Four times a day (QID) | ORAL | Status: DC
  Administered 2014-06-10 – 2014-06-12 (×9): 600 mg via ORAL
  Filled 2014-06-10 (×8): qty 1

## 2014-06-10 MED ORDER — OXYCODONE-ACETAMINOPHEN 5-325 MG PO TABS: 2.0000 | ORAL_TABLET | ORAL | Status: DC | PRN

## 2014-06-10 MED ORDER — PENICILLIN G POTASSIUM 5000000 UNITS IJ SOLR
2.5000 10*6.[IU] | INTRAVENOUS | Status: DC
  Filled 2014-06-10 (×2): qty 2.5

## 2014-06-10 MED ORDER — OXYTOCIN BOLUS FROM INFUSION
500.0000 mL | INTRAVENOUS | Status: DC
  Administered 2014-06-10: 500 mL via INTRAVENOUS

## 2014-06-10 MED ORDER — OXYTOCIN 40 UNITS IN LACTATED RINGERS INFUSION - SIMPLE MED
62.5000 mL/h | INTRAVENOUS | Status: DC
  Filled 2014-06-10: qty 1000

## 2014-06-10 MED ORDER — ZOLPIDEM TARTRATE 5 MG PO TABS: 5.0000 mg | ORAL_TABLET | Freq: Every evening | ORAL | Status: DC | PRN

## 2014-06-10 MED ORDER — LIDOCAINE HCL (PF) 1 % IJ SOLN
INTRAMUSCULAR | Status: DC | PRN
  Administered 2014-06-10 (×2): 4 mL

## 2014-06-10 MED ORDER — ONDANSETRON HCL 4 MG/2ML IJ SOLN: 4.0000 mg | INTRAMUSCULAR | Status: DC | PRN

## 2014-06-10 MED ORDER — ACETAMINOPHEN 325 MG PO TABS: 650.0000 mg | ORAL_TABLET | ORAL | Status: DC | PRN

## 2014-06-10 MED ORDER — OXYCODONE-ACETAMINOPHEN 5-325 MG PO TABS: 1.0000 | ORAL_TABLET | ORAL | Status: DC | PRN

## 2014-06-10 MED ORDER — EPHEDRINE 5 MG/ML INJ
10.0000 mg | INTRAVENOUS | Status: DC | PRN
  Filled 2014-06-10: qty 2

## 2014-06-10 MED ORDER — TETANUS-DIPHTH-ACELL PERTUSSIS 5-2.5-18.5 LF-MCG/0.5 IM SUSP
0.5000 mL | Freq: Once | INTRAMUSCULAR | Status: AC
  Administered 2014-06-11: 0.5 mL via INTRAMUSCULAR
  Filled 2014-06-10: qty 0.5

## 2014-06-10 MED ORDER — SIMETHICONE 80 MG PO CHEW: 80.0000 mg | CHEWABLE_TABLET | ORAL | Status: DC | PRN

## 2014-06-10 MED ORDER — DIPHENHYDRAMINE HCL 50 MG/ML IJ SOLN: 12.5000 mg | INTRAMUSCULAR | Status: DC | PRN

## 2014-06-10 MED ORDER — PHENYLEPHRINE 40 MCG/ML (10ML) SYRINGE FOR IV PUSH (FOR BLOOD PRESSURE SUPPORT)
80.0000 ug | PREFILLED_SYRINGE | INTRAVENOUS | Status: DC | PRN
  Administered 2014-06-10: 80 ug via INTRAVENOUS
  Filled 2014-06-10: qty 2

## 2014-06-10 MED ORDER — FLEET ENEMA 7-19 GM/118ML RE ENEM: 1.0000 | ENEMA | RECTAL | Status: DC | PRN

## 2014-06-10 MED ORDER — DIPHENHYDRAMINE HCL 25 MG PO CAPS: 25.0000 mg | ORAL_CAPSULE | Freq: Four times a day (QID) | ORAL | Status: DC | PRN

## 2014-06-10 MED ORDER — BENZOCAINE-MENTHOL 20-0.5 % EX AERO
1.0000 "application " | INHALATION_SPRAY | CUTANEOUS | Status: DC | PRN
  Administered 2014-06-10: 1 via TOPICAL
  Filled 2014-06-10: qty 56

## 2014-06-10 MED ORDER — PENICILLIN G POTASSIUM 5000000 UNITS IJ SOLR
5.0000 10*6.[IU] | Freq: Once | INTRAMUSCULAR | Status: AC
  Administered 2014-06-10: 5 10*6.[IU] via INTRAVENOUS
  Filled 2014-06-10: qty 5

## 2014-06-10 MED ORDER — LIDOCAINE HCL (PF) 1 % IJ SOLN
30.0000 mL | INTRAMUSCULAR | Status: DC | PRN
  Filled 2014-06-10: qty 30

## 2014-06-10 MED ORDER — DIBUCAINE 1 % RE OINT: 1.0000 "application " | TOPICAL_OINTMENT | RECTAL | Status: DC | PRN

## 2014-06-10 MED ORDER — ONDANSETRON HCL 4 MG/2ML IJ SOLN: 4.0000 mg | Freq: Four times a day (QID) | INTRAMUSCULAR | Status: DC | PRN

## 2014-06-10 MED ORDER — PRENATAL MULTIVITAMIN CH
1.0000 | ORAL_TABLET | Freq: Every day | ORAL | Status: DC
  Administered 2014-06-11 – 2014-06-12 (×2): 1 via ORAL
  Filled 2014-06-10 (×2): qty 1

## 2014-06-10 MED ORDER — LANOLIN HYDROUS EX OINT: TOPICAL_OINTMENT | CUTANEOUS | Status: DC | PRN

## 2014-06-10 MED ORDER — LACTATED RINGERS IV SOLN
INTRAVENOUS | Status: DC
Start: 2014-06-10 — End: 2014-06-10

## 2014-06-10 MED ORDER — WITCH HAZEL-GLYCERIN EX PADS: 1.0000 "application " | MEDICATED_PAD | CUTANEOUS | Status: DC | PRN

## 2014-06-10 MED ORDER — PHENYLEPHRINE 40 MCG/ML (10ML) SYRINGE FOR IV PUSH (FOR BLOOD PRESSURE SUPPORT)
80.0000 ug | PREFILLED_SYRINGE | INTRAVENOUS | Status: DC | PRN
  Filled 2014-06-10: qty 2
  Filled 2014-06-10: qty 10

## 2014-06-10 MED ORDER — SENNOSIDES-DOCUSATE SODIUM 8.6-50 MG PO TABS
2.0000 | ORAL_TABLET | ORAL | Status: DC
  Administered 2014-06-10 – 2014-06-11 (×2): 2 via ORAL
  Filled 2014-06-10 (×2): qty 2

## 2014-06-10 MED ORDER — LACTATED RINGERS IV SOLN
500.0000 mL | INTRAVENOUS | Status: DC | PRN
  Administered 2014-06-10: 500 mL via INTRAVENOUS

## 2014-06-10 MED ORDER — LACTATED RINGERS IV SOLN: 500.0000 mL | Freq: Once | INTRAVENOUS | Status: DC

## 2014-06-10 MED ORDER — CITRIC ACID-SODIUM CITRATE 334-500 MG/5ML PO SOLN: 30.0000 mL | ORAL | Status: DC | PRN

## 2014-06-10 MED ORDER — FENTANYL 2.5 MCG/ML BUPIVACAINE 1/10 % EPIDURAL INFUSION (WH - ANES)
INTRAMUSCULAR | Status: DC | PRN
  Administered 2014-06-10: 12.5 mL/h via EPIDURAL

## 2014-06-10 MED ORDER — INFLUENZA VAC SPLIT QUAD 0.5 ML IM SUSY
0.5000 mL | PREFILLED_SYRINGE | INTRAMUSCULAR | Status: AC
  Administered 2014-06-11: 0.5 mL via INTRAMUSCULAR
  Filled 2014-06-10: qty 0.5

## 2014-06-10 MED ORDER — FENTANYL 2.5 MCG/ML BUPIVACAINE 1/10 % EPIDURAL INFUSION (WH - ANES)
14.0000 mL/h | INTRAMUSCULAR | Status: DC | PRN
  Administered 2014-06-10: 14 mL/h via EPIDURAL
  Filled 2014-06-10: qty 125

## 2014-06-10 NOTE — MAU Note (Signed)
Report given to SterlingAleeta, RN [facility]. Strip reviewed. May transfer to room 172

## 2014-06-10 NOTE — Anesthesia Procedure Notes (Signed)
Epidural Patient location during procedure: OB Start time: 06/10/2014 7:25 AM  Staffing Anesthesiologist: Relena Ivancic A. Performed by: anesthesiologist   Preanesthetic Checklist Completed: patient identified, site marked, surgical consent, pre-op evaluation, timeout performed, IV checked, risks and benefits discussed and monitors and equipment checked  Epidural Patient position: right lateral decubitus Prep: site prepped and draped and DuraPrep Patient monitoring: continuous pulse ox and blood pressure Approach: midline Location: L3-L4 Injection technique: LOR air  Needle:  Needle type: Tuohy  Needle gauge: 17 G Needle length: 9 cm and 9 Needle insertion depth: 5 cm cm Catheter type: closed end flexible Catheter size: 19 Gauge Catheter at skin depth: 10 cm Test dose: negative and Other  Assessment Events: blood not aspirated, injection not painful, no injection resistance, negative IV test and no paresthesia  Additional Notes Patient identified. Risks and benefits discussed including failed block, incomplete  Pain control, post dural puncture headache, nerve damage, paralysis, blood pressure Changes, nausea, vomiting, reactions to medications-both toxic and allergic and post Partum back pain. All questions were answered. Patient expressed understanding and wished to proceed. Sterile technique was used throughout procedure. Epidural site was Dressed with sterile barrier dressing. No paresthesias, signs of intravascular injection Or signs of intrathecal spread were encountered.  Patient was more comfortable after the epidural was dosed. Please see RN's note for documentation of vital signs and FHR which are stable.

## 2014-06-10 NOTE — MAU Note (Signed)
Pt reports ROM at 0300

## 2014-06-10 NOTE — Anesthesia Preprocedure Evaluation (Signed)
Anesthesia Evaluation  Patient identified by MRN, date of birth, ID band Patient awake    Reviewed: Allergy & Precautions, H&P , NPO status , Patient's Chart, lab work & pertinent test results  Airway Mallampati: II  TM Distance: >3 FB Neck ROM: Full    Dental no notable dental hx. (+) Teeth Intact   Pulmonary neg pulmonary ROS,  breath sounds clear to auscultation  Pulmonary exam normal       Cardiovascular negative cardio ROS  Rhythm:Regular Rate:Normal     Neuro/Psych negative neurological ROS  negative psych ROS   GI/Hepatic negative GI ROS, Neg liver ROS,   Endo/Other  negative endocrine ROS  Renal/GU negative Renal ROS  negative genitourinary   Musculoskeletal negative musculoskeletal ROS (+)   Abdominal   Peds  Hematology  (+) anemia ,   Anesthesia Other Findings   Reproductive/Obstetrics (+) Pregnancy                             Anesthesia Physical Anesthesia Plan  ASA: II  Anesthesia Plan: Epidural   Post-op Pain Management:    Induction:   Airway Management Planned: Natural Airway  Additional Equipment:   Intra-op Plan:   Post-operative Plan:   Informed Consent: I have reviewed the patients History and Physical, chart, labs and discussed the procedure including the risks, benefits and alternatives for the proposed anesthesia with the patient or authorized representative who has indicated his/her understanding and acceptance.     Plan Discussed with: Anesthesiologist  Anesthesia Plan Comments:         Anesthesia Quick Evaluation

## 2014-06-10 NOTE — MAU Note (Signed)
Pt in room. Will apply monitors

## 2014-06-10 NOTE — Anesthesia Postprocedure Evaluation (Signed)
  Anesthesia Post-op Note  Patient: Erica Jennings  Procedure(s) Performed: * No procedures listed *  Patient Location: Mother/Baby  Anesthesia Type:Epidural  Level of Consciousness: awake, alert , oriented and patient cooperative  Airway and Oxygen Therapy: Patient Spontanous Breathing  Post-op Pain: mild  Post-op Assessment: Patient's Cardiovascular Status Stable, Respiratory Function Stable, No headache, No backache and No residual motor weakness;report slight sensation of numbness left thigh although states it is less than before and ambulating without problem.  Post-op Vital Signs: stable  Last Vitals:  Filed Vitals:   06/10/14 1239  BP: 105/47  Pulse: 87  Temp: 37.2 C  Resp: 18    Complications: No apparent anesthesia complications

## 2014-06-10 NOTE — Lactation Note (Signed)
This note was copied from the chart of Erica Alvino [name]. Lactation Consultation Note  P1, Assisted mother in cross cradle hold.  Baby latched. Sucks and some swallows observed. Helped with positioning and pillow placement. Reviewed cluster feeding. Mom encouraged to feed baby 8-12 times/24 hours and with feeding cues.  Mom made aware of O/P services, breastfeeding support groups, community resources, and our phone # for post-discharge questions.    Patient Name: Erica Jennings Reason for consult: Initial assessment   Maternal Data Has patient been taught Hand Expression?: Yes  Feeding Feeding Type: Breast Fed  LATCH Score/Interventions Latch: Repeated attempts needed to sustain latch, nipple held in mouth throughout feeding, stimulation needed to elicit sucking reflex. Intervention(s): Teach feeding cues Intervention(s): Adjust position;Assist with latch;Breast massage  Audible Swallowing: A few with stimulation Intervention(s): Skin to skin  Type of Nipple: Everted at rest and after stimulation  Comfort (Breast/Nipple): Soft / non-tender     Hold (Positioning): Assistance needed to correctly position infant at breast and maintain latch.  LATCH Score: 7  Lactation Tools Discussed/Used     Consult Status Consult Status: Follow-up Date: 06/11/14 Follow-up type: In-patient    Dahlia ByesBerkelhammer, [name], 9:53 PM

## 2014-06-11 LAB — CBC
HCT: 25.1 % — ABNORMAL LOW (ref 36.0–46.0)
HEMOGLOBIN: 7.6 g/dL — AB (ref 12.0–15.0)
MCH: 18.1 pg — AB (ref 26.0–34.0)
MCHC: 30.3 g/dL (ref 30.0–36.0)
MCV: 59.8 fL — ABNORMAL LOW (ref 78.0–100.0)
Platelets: 183 10*3/uL (ref 150–400)
RBC: 4.2 MIL/uL (ref 3.87–5.11)
RDW: 17.9 % — ABNORMAL HIGH (ref 11.5–15.5)
WBC: 15.8 10*3/uL — ABNORMAL HIGH (ref 4.0–10.5)

## 2014-06-12 MED ORDER — IBUPROFEN 600 MG PO TABS: 600.0000 mg | ORAL_TABLET | Freq: Four times a day (QID) | ORAL | Status: DC

## 2014-06-12 NOTE — Progress Notes (Signed)
Post Partum Day 2 Subjective: no complaints, up ad lib, voiding, tolerating PO, + flatus and breast feeding  Objective: Blood pressure 105/55, pulse 70, temperature 98.5 F (36.9 C), temperature source Oral, resp. rate 18, height 5' 1.5" (1.562 m), weight 72.576 kg (160 lb), last menstrual period 09/09/2013, SpO2 100 %, unknown if currently breastfeeding.  Physical Exam:  General: alert, cooperative and no distress Lochia: appropriate Uterine Fundus: firm DVT Evaluation: No evidence of DVT seen on physical exam. Negative Homan's sign. No cords or calf tenderness. No significant calf/ankle edema.   Recent Labs  06/10/14 0545 06/11/14 0525  HGB 8.9* 7.6*  HCT 29.1* 25.1*    Assessment/Plan: Discharge home, Breastfeeding and Circumcision in office   LOS: 2 days   Erica Jennings STACIA 06/12/2014, 9:31 AM

## 2014-06-12 NOTE — Lactation Note (Signed)
This note was copied from the chart of Erica Alvino [name]. Lactation Consultation Note  Mother denies soreness or questions.  She states breastfeeding is going well. Baby's stools are transitioning. Encouraged parents to monitor void/stools and call for questions. Mom encouraged to feed baby 8-12 times/24 hours and with feeding cues.  Reviewed engorgement care.  Patient Name: Erica Jennings ZOXWR'UToday's Date: 06/12/2014 Reason for consult: Follow-up assessment   Maternal Data    Feeding Feeding Type: Breast Fed  LATCH Score/Interventions Latch: Repeated attempts needed to sustain latch, nipple held in mouth throughout feeding, stimulation needed to elicit sucking reflex.  Audible Swallowing: A few with stimulation  Type of Nipple: Everted at rest and after stimulation  Comfort (Breast/Nipple): Soft / non-tender     Hold (Positioning): No assistance needed to correctly position infant at breast.  LATCH Score: 8  Lactation Tools Discussed/Used     Consult Status Consult Status: Complete    Hardie PulleyBerkelhammer, Analleli Gierke Boschen 06/12/2014, 9:07 AM

## 2014-06-12 NOTE — Progress Notes (Signed)
Clinical Social Work Department BRIEF PSYCHOSOCIAL ASSESSMENT 06/12/2014  Patient:  Erica Jennings,Erica Jennings     Account Number:  [id number]     Admit date:  06/10/2014  Clinical Social Worker:  Rashidah [place], CLINICAL SOCIAL WORKER  Date/Time:  06/12/2014 11:00 AM  Referred by:  Physician  Date Referred:  06/12/2014 Referred for  Psychosocial assessment   Interview type:  Family Other interview type:   MOB provided consent for the MGM and the FOB to be present.   PSYCHOSOCIAL DATA Living Status:  FAMILY  Primary support relationship to patient: Father of the baby Degree of support available:   MOB also reported that her parents are supportive.  She stated that they have strong family support from both sets of parents.  She reported intentions to stay with her mother during the upcoming days to provide her with additional support.    CURRENT CONCERNS Current Concerns  Other - See comment   Other Concerns:   MOB was referred for therapy during the pregnancy due to reports of feelings stressed and overwhelmed secondary to role transition to parenthood and limited space in their home.   SOCIAL WORK ASSESSMENT / PLAN CSW met with the MOB prior to discharge upon MD request due to documentation in OB Chart (see above concern) about MOB reporting increased stress during the pregnancy.  Nursing staff reported to CSW that MOB has been appropriate since the delivery and no acute stressors have been noted.  When CSW entered the room, MOB was preparing for discharge, but was willing/receptive to meet with the CSW.  She displayed a full range in affect, presented in a pleasant mood, and did not present with any acute mental health symptoms.   MOB presented as excited to transition to her new role as her mother.  She shared that she has enjoyed the role thus far and denied any stress or anxiety related to the transition. MOB reflected upon her strong family support which she believes will be helpful in the  next few weeks.  MOB confirmed that the house is well prepared and that she is looking forward to her return home.   CSW inquired about referral to [facility].  MOB stated that her NP recommended the referral since she was feeling "stressed", but she discussed normative stress secondary to transition to parenthood.  MOB stated that she did not follow up with the referral since she believed her stress was well controlled.  MOB denied additional feelings of stress and shared belief that she is transitioning well to parenthood.  MOB denied any other mental health history.  CSW provided education on postpartum depression, and MOB acknowledged ability to go to [facility] and talk to her MD if she notes any symptoms.   No further questions from the MOB.  No barriers to discharge.  Assessment/plan status:  No Further Intervention Required/No barriers to discharge Other assessment/ plan:   CSW provided education on postpartum depression   Information/referral to community resources:   No referrals needed at this time.  CSW reveiwed services at [facility] if MOB notes symptoms of postpartum depression or becomes overwhelmed/stressed again.    PATIENT'S/FAMILY'S RESPONSE TO PLAN OF CARE: MOB and FOB expressed appreciation for the visit.  They denied additional questions or questions, and verbalized intention to contact her MD or [facility] if she experiences mental health concerns.        

## 2014-06-12 NOTE — Discharge Summary (Signed)
Obstetric Discharge Summary Reason for Admission: onset of labor Prenatal Procedures: none Intrapartum Procedures: spontaneous vaginal delivery Postpartum Procedures: none Complications-Operative and Postpartum: none HEMOGLOBIN  Date Value Ref Range Status  06/11/2014 7.6* 12.0 - 15.0 g/dL Final   HCT  Date Value Ref Range Status  06/11/2014 25.1* 36.0 - 46.0 % Final    Physical Exam:  General: alert, cooperative and no distress Lochia: appropriate Uterine Fundus: firm DVT Evaluation: No evidence of DVT seen on physical exam. Negative Homan's sign. No cords or calf tenderness. No significant calf/ankle edema.   Discharge Diagnoses: Term Pregnancy-delivered  Discharge Information: Date: 06/12/2014 Activity: pelvic rest Diet: routine Medications: PNV and Ibuprofen Condition: stable Instructions: refer to practice specific booklet Discharge to: home   Newborn Data: Live born female  Birth Weight: 7 lb 4.8 oz (3310 g) APGAR: 9, 9  Home with mother.  Essie HartINN, Erman Thum STACIA 06/12/2014, 9:35 AM

## 2014-06-16 NOTE — H&P (Signed)
Pt is a 23 y/o black female, G1 P0 who is admitted in labor. PNC was uncomplicated.  VSSAF PMHX: Please see PN record. IMP/ IUP at term in labor Plan/ Admit

## 2014-07-13 ENCOUNTER — Other Ambulatory Visit: Payer: Self-pay

## 2014-07-14 LAB — CYTOLOGY - PAP

## 2014-11-25 ENCOUNTER — Encounter (HOSPITAL_COMMUNITY): Payer: Self-pay | Admitting: Emergency Medicine

## 2014-11-25 ENCOUNTER — Emergency Department (HOSPITAL_COMMUNITY)
Admission: EM | Admit: 2014-11-25 | Discharge: 2014-11-25 | Disposition: A | Discharge status: Home / Self Care | Payer: BLUE CROSS/BLUE SHIELD | Attending: Emergency Medicine | Admitting: Emergency Medicine

## 2014-11-25 DIAGNOSIS — R112 Nausea with vomiting, unspecified: Secondary | ICD-10-CM

## 2014-11-25 DIAGNOSIS — N390 Urinary tract infection, site not specified: Secondary | ICD-10-CM | POA: Insufficient documentation

## 2014-11-25 DIAGNOSIS — R197 Diarrhea, unspecified: Secondary | ICD-10-CM | POA: Diagnosis not present

## 2014-11-25 DIAGNOSIS — D649 Anemia, unspecified: Secondary | ICD-10-CM | POA: Insufficient documentation

## 2014-11-25 DIAGNOSIS — Z79899 Other long term (current) drug therapy: Secondary | ICD-10-CM | POA: Diagnosis not present

## 2014-11-25 DIAGNOSIS — Z3202 Encounter for pregnancy test, result negative: Secondary | ICD-10-CM | POA: Insufficient documentation

## 2014-11-25 DIAGNOSIS — R1030 Lower abdominal pain, unspecified: Secondary | ICD-10-CM | POA: Diagnosis present

## 2014-11-25 LAB — URINALYSIS, ROUTINE W REFLEX MICROSCOPIC
Bilirubin Urine: NEGATIVE
Glucose, UA: NEGATIVE mg/dL
Ketones, ur: 15 mg/dL — AB
Leukocytes, UA: NEGATIVE
NITRITE: POSITIVE — AB
PH: 6.5 (ref 5.0–8.0)
PROTEIN: NEGATIVE mg/dL
Specific Gravity, Urine: 1.025 (ref 1.005–1.030)
UROBILINOGEN UA: 1 mg/dL (ref 0.0–1.0)

## 2014-11-25 LAB — LIPASE, BLOOD: Lipase: 25 U/L (ref 22–51)

## 2014-11-25 LAB — CBC WITH DIFFERENTIAL/PLATELET
Basophils Absolute: 0 10*3/uL (ref 0.0–0.1)
Basophils Relative: 0 % (ref 0–1)
Eosinophils Absolute: 0.1 10*3/uL (ref 0.0–0.7)
Eosinophils Relative: 1 % (ref 0–5)
HCT: 36.8 % (ref 36.0–46.0)
Hemoglobin: 11.5 g/dL — ABNORMAL LOW (ref 12.0–15.0)
LYMPHS ABS: 1.3 10*3/uL (ref 0.7–4.0)
LYMPHS PCT: 18 % (ref 12–46)
MCH: 21.1 pg — ABNORMAL LOW (ref 26.0–34.0)
MCHC: 31.3 g/dL (ref 30.0–36.0)
MCV: 67.6 fL — ABNORMAL LOW (ref 78.0–100.0)
MONO ABS: 0.3 10*3/uL (ref 0.1–1.0)
Monocytes Relative: 5 % (ref 3–12)
Neutro Abs: 5.2 10*3/uL (ref 1.7–7.7)
Neutrophils Relative %: 76 % (ref 43–77)
Platelets: 307 10*3/uL (ref 150–400)
RBC: 5.44 MIL/uL — ABNORMAL HIGH (ref 3.87–5.11)
RDW: 14.8 % (ref 11.5–15.5)
WBC: 6.9 10*3/uL (ref 4.0–10.5)

## 2014-11-25 LAB — COMPREHENSIVE METABOLIC PANEL
ALT: 15 U/L (ref 14–54)
ANION GAP: 5 (ref 5–15)
AST: 18 U/L (ref 15–41)
Albumin: 4.5 g/dL (ref 3.5–5.0)
Alkaline Phosphatase: 81 U/L (ref 38–126)
BILIRUBIN TOTAL: 1.5 mg/dL — AB (ref 0.3–1.2)
BUN: 13 mg/dL (ref 6–20)
CALCIUM: 8.7 mg/dL — AB (ref 8.9–10.3)
CHLORIDE: 106 mmol/L (ref 101–111)
CO2: 23 mmol/L (ref 22–32)
CREATININE: 0.75 mg/dL (ref 0.44–1.00)
GFR calc Af Amer: >60 mL/min (ref >60)
GFR calc non Af Amer: >60 mL/min (ref >60)
Glucose, Bld: 96 mg/dL (ref 65–99)
Potassium: 4.1 mmol/L (ref 3.5–5.1)
SODIUM: 134 mmol/L — AB (ref 135–145)
Total Protein: 7.8 g/dL (ref 6.5–8.1)

## 2014-11-25 LAB — URINE MICROSCOPIC-ADD ON

## 2014-11-25 LAB — POC URINE PREG, ED: Preg Test, Ur: NEGATIVE

## 2014-11-25 MED ORDER — LOPERAMIDE HCL 2 MG PO CAPS
2.0000 mg | ORAL_CAPSULE | Freq: Once | ORAL | Status: AC
  Administered 2014-11-25: 2 mg via ORAL
  Filled 2014-11-25: qty 1

## 2014-11-25 MED ORDER — ONDANSETRON 4 MG PO TBDP
4.0000 mg | ORAL_TABLET | Freq: Once | ORAL | Status: AC
  Administered 2014-11-25: 4 mg via ORAL
  Filled 2014-11-25: qty 1

## 2014-11-25 MED ORDER — ONDANSETRON 4 MG PO TBDP
4.0000 mg | ORAL_TABLET | Freq: Three times a day (TID) | ORAL | Status: DC | PRN
Start: 2014-11-25 — End: 2017-08-14

## 2014-11-25 MED ORDER — CEPHALEXIN 500 MG PO CAPS
500.0000 mg | ORAL_CAPSULE | Freq: Once | ORAL | Status: AC
  Administered 2014-11-25: 500 mg via ORAL
  Filled 2014-11-25: qty 1

## 2014-11-25 MED ORDER — CEPHALEXIN 500 MG PO CAPS: 500.0000 mg | ORAL_CAPSULE | Freq: Two times a day (BID) | ORAL | Status: DC

## 2014-11-25 MED ORDER — IBUPROFEN 800 MG PO TABS
800.0000 mg | ORAL_TABLET | Freq: Once | ORAL | Status: AC
  Administered 2014-11-25: 800 mg via ORAL
  Filled 2014-11-25: qty 1

## 2014-11-25 NOTE — ED Notes (Signed)
Pt c/o abd pain with n/v/d.   

## 2014-11-25 NOTE — Discharge Instructions (Signed)
Viral Gastroenteritis Viral gastroenteritis is also known as stomach flu. This condition affects the stomach and intestinal tract. It can cause sudden diarrhea and vomiting. The illness typically lasts 3 to 8 days. Most people develop an immune response that eventually gets rid of the virus. While this natural response develops, the virus can make you quite ill. CAUSES  Many different viruses can cause gastroenteritis, such as rotavirus or noroviruses. You can catch one of these viruses by consuming contaminated food or water. You may also catch a virus by sharing utensils or other personal items with an infected person or by touching a contaminated surface. SYMPTOMS  The most common symptoms are diarrhea and vomiting. These problems can cause a severe loss of body fluids (dehydration) and a body salt (electrolyte) imbalance. Other symptoms may include:  Fever.  Headache.  Fatigue.  Abdominal pain. DIAGNOSIS  Your caregiver can usually diagnose viral gastroenteritis based on your symptoms and a physical exam. A stool sample may also be taken to test for the presence of viruses or other infections. TREATMENT  This illness typically goes away on its own. Treatments are aimed at rehydration. The most serious cases of viral gastroenteritis involve vomiting so severely that you are not able to keep fluids down. In these cases, fluids must be given through an intravenous line (IV). HOME CARE INSTRUCTIONS   Drink enough fluids to keep your urine clear or pale yellow. Drink small amounts of fluids frequently and increase the amounts as tolerated.  Ask your caregiver for specific rehydration instructions.  Avoid:  Foods high in sugar.  Alcohol.  Carbonated drinks.  Tobacco.  Juice.  Caffeine drinks.  Extremely hot or cold fluids.  Fatty, greasy foods.  Too much intake of anything at one time.  Dairy products until 24 to 48 hours after diarrhea stops.  You may consume probiotics.  Probiotics are active cultures of beneficial bacteria. They may lessen the amount and number of diarrheal stools in adults. Probiotics can be found in yogurt with active cultures and in supplements.  Wash your hands well to avoid spreading the virus.  Only take over-the-counter or prescription medicines for pain, discomfort, or fever as directed by your caregiver. Do not give aspirin to children. Antidiarrheal medicines are not recommended.  Ask your caregiver if you should continue to take your regular prescribed and over-the-counter medicines.  Keep all follow-up appointments as directed by your caregiver. SEEK IMMEDIATE MEDICAL CARE IF:   You are unable to keep fluids down.  You do not urinate at least once every 6 to 8 hours.  You develop shortness of breath.  You notice blood in your stool or vomit. This may look like coffee grounds.  You have abdominal pain that increases or is concentrated in one small area (localized).  You have persistent vomiting or diarrhea.  You have a fever.  The patient is a child younger than 3 months, and he or she has a fever.  The patient is a child older than 3 months, and he or she has a fever and persistent symptoms.  The patient is a child older than 3 months, and he or she has a fever and symptoms suddenly get worse.  The patient is a baby, and he or she has no tears when crying. MAKE SURE YOU:   Understand these instructions.  Will watch your condition.  Will get help right away if you are not doing well or get worse. Document Released: 06/12/2005 Document Revised: 09/04/2011 Document Reviewed: 03/29/2011   ExitCare Patient Information 2015 [employer]. This information is not intended to replace advice given to you by your health care provider. Make sure you discuss any questions you have with your health care provider. Urinary Tract Infection Urinary tract infections (UTIs) can develop anywhere along your urinary tract. Your  urinary tract is your body's drainage system for removing wastes and extra water. Your urinary tract includes two kidneys, two ureters, a bladder, and a urethra. Your kidneys are a pair of bean-shaped organs. Each kidney is about the size of your fist. They are located below your ribs, one on each side of your spine. CAUSES Infections are caused by microbes, which are microscopic organisms, including fungi, viruses, and bacteria. These organisms are so small that they can only be seen through a microscope. Bacteria are the microbes that most commonly cause UTIs. SYMPTOMS  Symptoms of UTIs may vary by age and gender of the patient and by the location of the infection. Symptoms in young women typically include a frequent and intense urge to urinate and a painful, burning feeling in the bladder or urethra during urination. Older women and men are more likely to be tired, shaky, and weak and have muscle aches and abdominal pain. A fever may mean the infection is in your kidneys. Other symptoms of a kidney infection include pain in your back or sides below the ribs, nausea, and vomiting. DIAGNOSIS To diagnose a UTI, your caregiver will ask you about your symptoms. Your caregiver also will ask to provide a urine sample. The urine sample will be tested for bacteria and white blood cells. White blood cells are made by your body to help fight infection. TREATMENT  Typically, UTIs can be treated with medication. Because most UTIs are caused by a bacterial infection, they usually can be treated with the use of antibiotics. The choice of antibiotic and length of treatment depend on your symptoms and the type of bacteria causing your infection. HOME CARE INSTRUCTIONS  If you were prescribed antibiotics, take them exactly as your caregiver instructs you. Finish the medication even if you feel better after you have only taken some of the medication.  Drink enough water and fluids to keep your urine clear or pale  yellow.  Avoid caffeine, tea, and carbonated beverages. They tend to irritate your bladder.  Empty your bladder often. Avoid holding urine for long periods of time.  Empty your bladder before and after sexual intercourse.  After a bowel movement, women should cleanse from front to back. Use each tissue only once. SEEK MEDICAL CARE IF:   You have back pain.  You develop a fever.  Your symptoms do not begin to resolve within 3 days. SEEK IMMEDIATE MEDICAL CARE IF:   You have severe back pain or lower abdominal pain.  You develop chills.  You have nausea or vomiting.  You have continued burning or discomfort with urination. MAKE SURE YOU:   Understand these instructions.  Will watch your condition.  Will get help right away if you are not doing well or get worse. Document Released: 03/22/2005 Document Revised: 12/12/2011 Document Reviewed: 07/21/2011 ExitCare Patient Information 2015 [employer]. This information is not intended to replace advice given to you by your health care provider. Make sure you discuss any questions you have with your health care provider.  

## 2014-11-25 NOTE — ED Provider Notes (Signed)
TIME SEEN: 5:10 AM  CHIEF COMPLAINT: Headache, nausea, vomiting, diarrhea  HPI: Pt is a 24 y.o. of anemia who presents to the emergency Department with headache that started yesterday afternoon and then subsequent sharp lower abdominal pains, diarrhea, nausea, vomiting. No sick contacts or recent travel. No fever. She has a Depo injection after having her son 5 months ago and has had irregular bleeding since. No vaginal bleeding currently. No vaginal discharge. No history of abdominal surgery.  ROS: See HPI Constitutional: no fever  Eyes: no drainage  ENT: no runny nose   Cardiovascular:  no chest pain  Resp: no SOB  GI:  vomiting GU: no dysuria Integumentary: no rash  Allergy: no hives  Musculoskeletal: no leg swelling  Neurological: no slurred speech ROS otherwise negative  PAST MEDICAL HISTORY/PAST SURGICAL HISTORY:  Past Medical History  Diagnosis Date  . Anemia     MEDICATIONS:  Prior to Admission medications   Medication Sig Start Date End Date Taking? Authorizing Provider  ibuprofen (ADVIL,MOTRIN) 600 MG tablet Take 1 tablet (600 mg total) by mouth every 6 (six) hours. 06/12/14   Essie HartWalda Pinn, MD  Prenatal Vit-Fe Fumarate-FA (PRENATAL MULTIVITAMIN) TABS tablet Take 1 tablet by mouth daily at 12 noon.    Historical Provider, MD    ALLERGIES:  No Known Allergies  SOCIAL HISTORY:  History  Substance Use Topics  . Smoking status: Never Smoker   . Smokeless tobacco: Never Used  . Alcohol Use: No    FAMILY HISTORY: Family History  Problem Relation Age of Onset  . Alcohol abuse Neg Hx   . Arthritis Neg Hx   . Asthma Neg Hx   . Birth defects Neg Hx   . Cancer Neg Hx   . COPD Neg Hx   . Depression Neg Hx   . Diabetes Neg Hx   . Drug abuse Neg Hx   . Early death Neg Hx   . Hearing loss Neg Hx   . Heart disease Neg Hx   . Hyperlipidemia Neg Hx   . Hypertension Neg Hx   . Kidney disease Neg Hx   . Learning disabilities Neg Hx   . Mental illness Neg Hx   .  Mental retardation Neg Hx   . Miscarriages / Stillbirths Neg Hx   . Stroke Neg Hx   . Vision loss Neg Hx   . Varicose Veins Neg Hx     EXAM: BP 121/51 mmHg  Pulse 85  Temp(Src) 99.1 F (37.3 C)  Resp 18  Ht 5\' 1"  (1.549 m)  Wt 114 lb (51.71 kg)  BMI 21.55 kg/m2  SpO2 96% CONSTITUTIONAL: Alert and oriented and responds appropriately to questions. Well-appearing; well-nourished HEAD: Normocephalic EYES: Conjunctivae clear, PERRL ENT: normal nose; no rhinorrhea; moist mucous membranes; pharynx without lesions noted NECK: Supple, no meningismus, no LAD  CARD: RRR; S1 and S2 appreciated; no murmurs, no clicks, no rubs, no gallops RESP: Normal chest excursion without splinting or tachypnea; breath sounds clear and equal bilaterally; no wheezes, no rhonchi, no rales, no hypoxia or respiratory distress, speaking full sentences ABD/GI: Normal bowel sounds; non-distended; soft, non-tender, no rebound, no guarding, no peritoneal signs BACK:  The back appears normal and is non-tender to palpation, there is no CVA tenderness EXT: Normal ROM in all joints; non-tender to palpation; no edema; normal capillary refill; no cyanosis, no calf tenderness or swelling    SKIN: Normal color for age and race; warm NEURO: Moves all extremities equally, sensation to light touch  intact diffusely, cranial nerves II through XII intact PSYCH: The patient's mood and manner are appropriate. Grooming and personal hygiene are appropriate.  MEDICAL DECISION MAKING: Patient here with likely viral gastroenteritis. Abdominal exam benign. Hemodynamically stable. Labs unremarkable. Urine does show nitrite positive UTI. She reports she gets UTIs frequently. Culture pending. Will discharge on Keflex. We'll give Zofran, Imodium and by mouth challenge. Anticipate discharge home.  ED PROGRESS: Patient drinking liquids without difficulty. I feel she is safe to be discharged home. We'll discharge with prescription for Zofran and  Keflex. Discussed return precautions. She verbalizes understanding and is comfortable with plan.     Layla Maw Ward, DO 11/25/14 [phone]

## 2014-11-25 NOTE — ED Notes (Signed)
Patient tolerated po fluids w/o any difficulties.

## 2014-11-27 LAB — URINE CULTURE: Colony Count: >=100000

## 2014-11-29 ENCOUNTER — Telehealth (HOSPITAL_COMMUNITY): Payer: Self-pay

## 2014-11-29 NOTE — Telephone Encounter (Signed)
Post ED Visit - Positive Culture Follow-up  Culture report reviewed by antimicrobial stewardship pharmacist: []  Erica Jennings, Pharm.D., BCPS []  Erica Jennings, Pharm.D., BCPS []  Erica Jennings, [address] BCPS []  Erica Jennings, [place]arm[place] BCPS, AAHIVP [x]  Erica Jennings, Pharm.D., BCPS, AAHIVP []  Erica Jennings, [address]Ph[address] BCPS  Positive Urine culture, >/= 100,000 colonies -> E Coli Treated with Cephalexin, organism sensitive to the same and no further patient follow-up is required at this time.  Erica Jennings, Erica Jennings 11/29/2014, 7:38 PM

## 2016-01-15 IMAGING — US US OB COMP LESS 14 WK
1 series · 14 of 28 positions shown · non-contrast
Comparison: None.

CLINICAL DATA: Cramping.  Mass anterior vagina.

EXAM:
OBSTETRIC <14 WK US AND TRANSVAGINAL OB US
TECHNIQUE: Both transabdominal and transvaginal ultrasound examinations were
performed for complete evaluation of the gestation as well as the
maternal uterus, adnexal regions, and pelvic cul-de-sac.
Transvaginal technique was performed to assess early pregnancy.

[Series 1: us ob comp less 14 wks · 58 acquisitions, 14 frames shown]
[im 3/58]
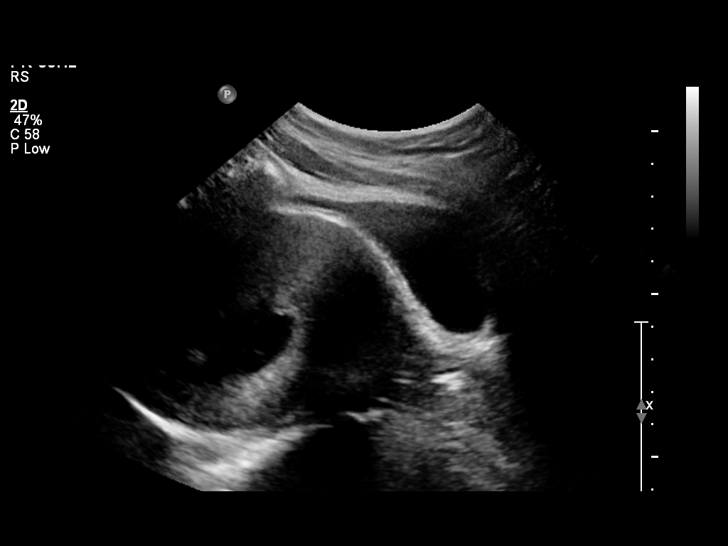
[im 7/58]
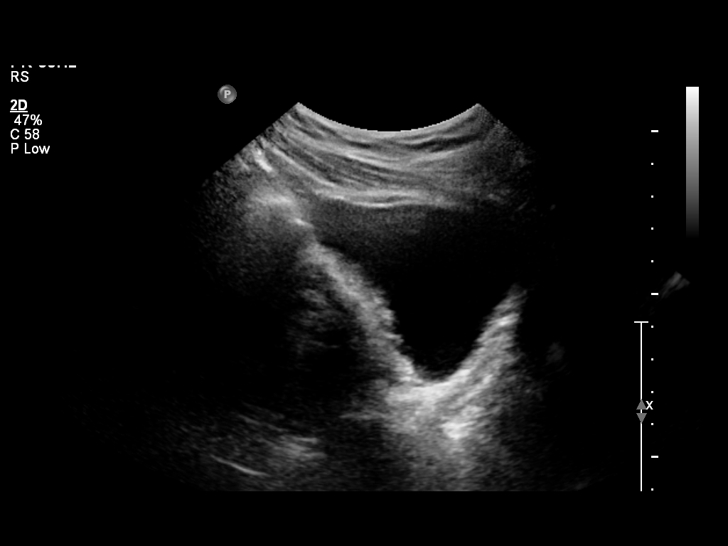
[im 11/58]
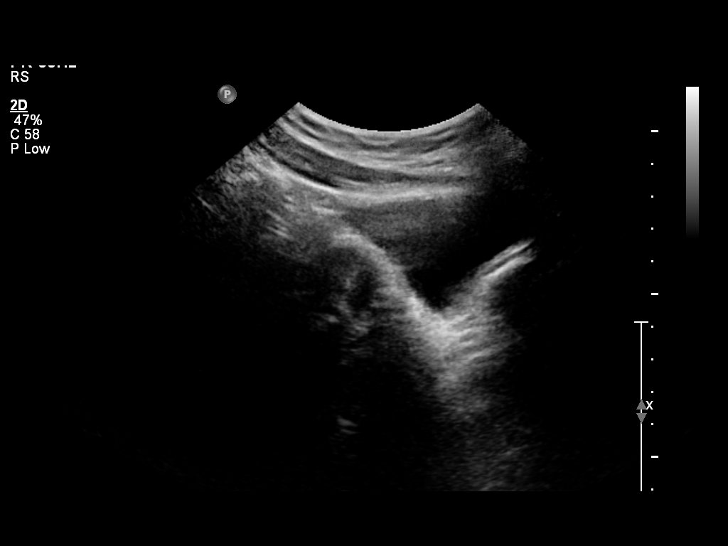
[im 15/58]
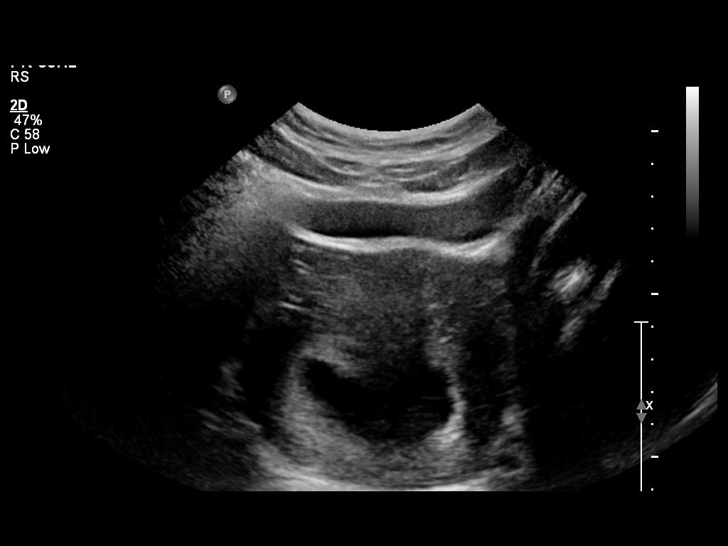
[im 20/58]
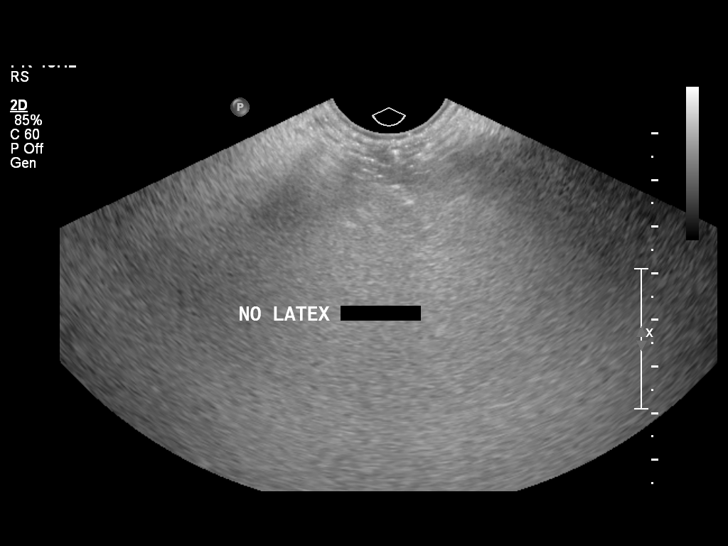
[im 24/58]
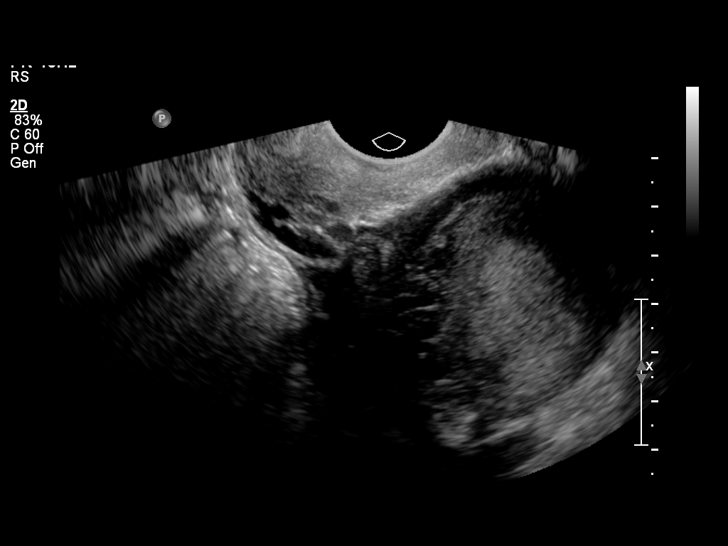
[im 28/58]
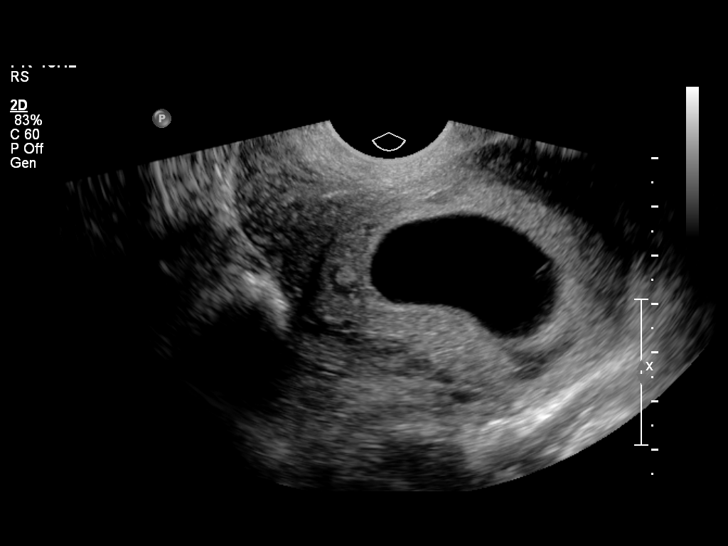
[im 32/58]
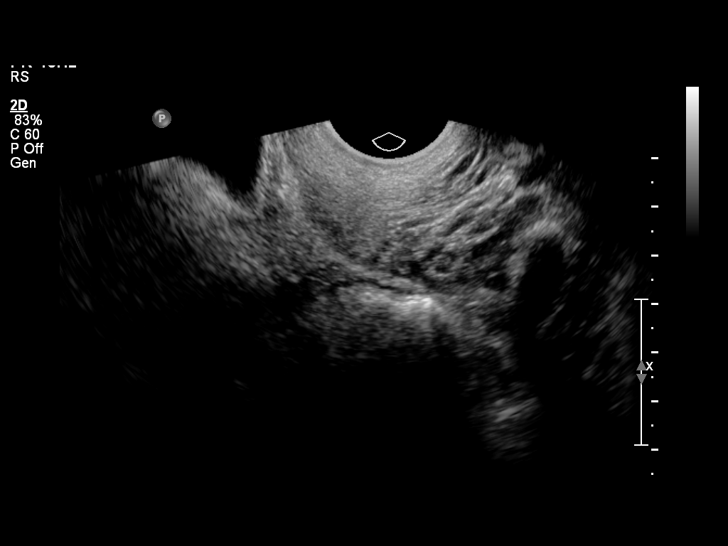
[im 36/58]
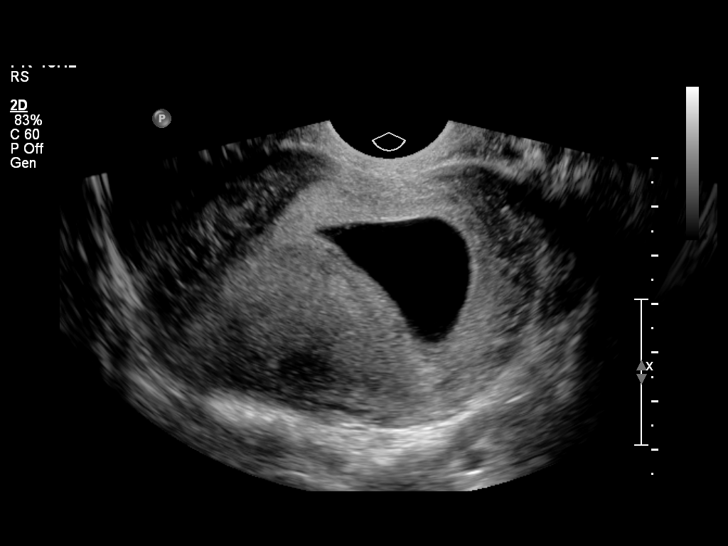
[im 41/58]
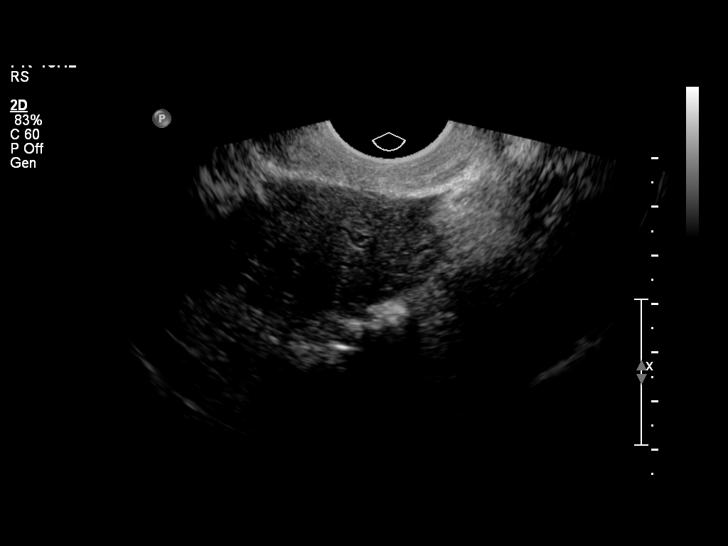
[im 45/58]
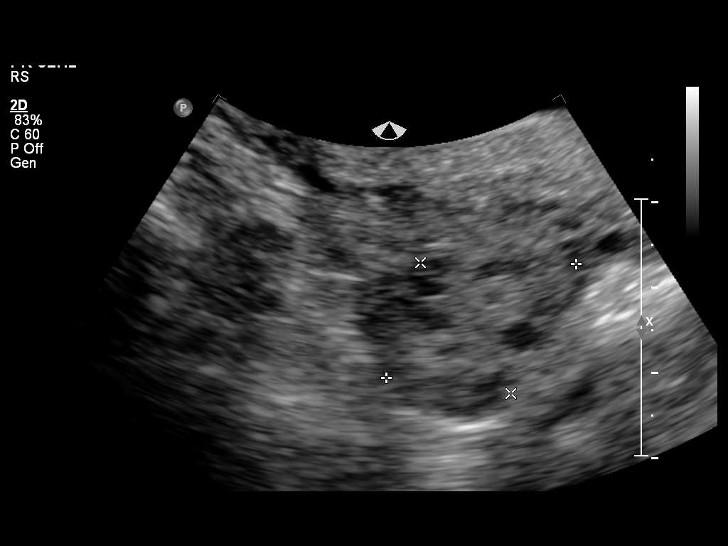
[im 49/58]
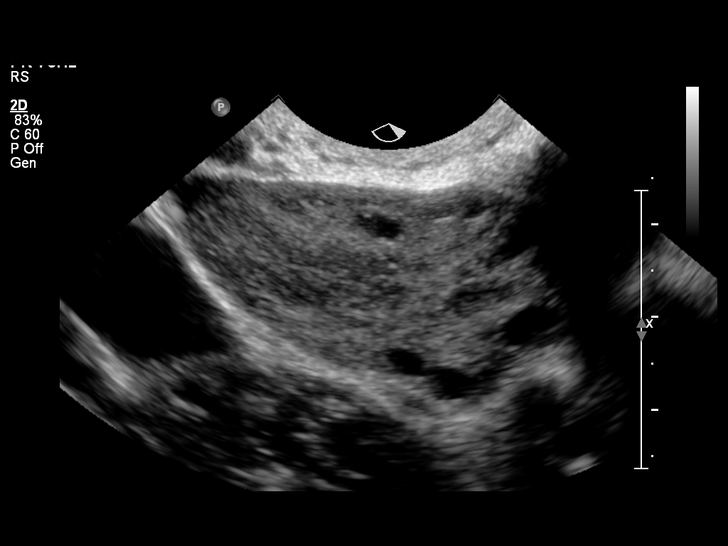
[im 53/58]
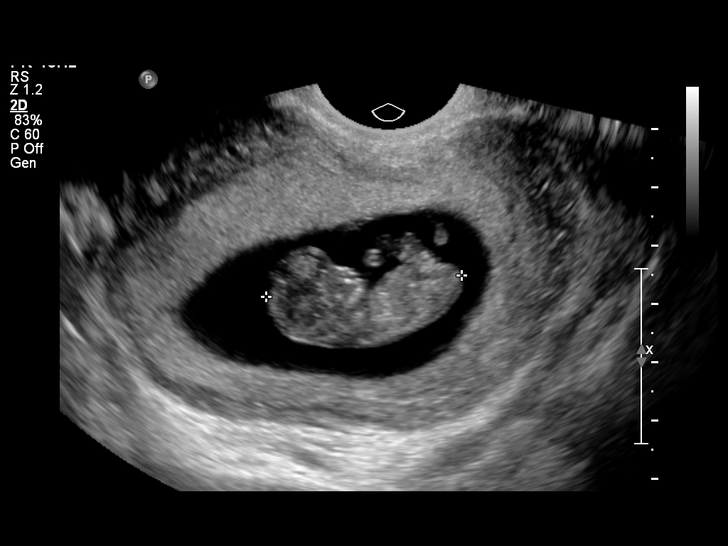
[im 58/58]
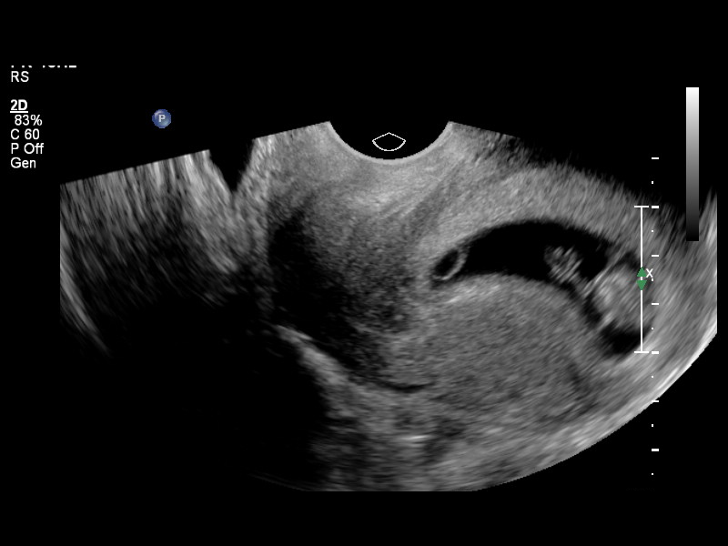

[14 of 28 positions shown; findings below may reference images not displayed]

FINDINGS: Intrauterine gestational sac: Visualized/normal in shape.

Yolk sac:  Present.

Embryo:  Present.

Cardiac Activity: Present.

Heart Rate:  158 bpm

CRL:   3.48 cm  mm   10 w 3 d                  US EDC: 06/19/2014.

Maternal uterus/adnexae: No significant abnormality.  No free fluid.
IMPRESSION: Single viable intrauterine pregnancy 10 weeks 3 days. No mass lesion
identified.

## 2016-01-31 ENCOUNTER — Other Ambulatory Visit (HOSPITAL_COMMUNITY): Payer: Self-pay | Admitting: Preventative Medicine

## 2016-01-31 ENCOUNTER — Ambulatory Visit (HOSPITAL_COMMUNITY)
Admission: RE | Admit: 2016-01-31 | Discharge: 2016-01-31 | Disposition: A | Discharge status: Home / Self Care | Payer: 59 | Source: Ambulatory Visit | Attending: Preventative Medicine | Admitting: Preventative Medicine

## 2016-01-31 DIAGNOSIS — M25561 Pain in right knee: Secondary | ICD-10-CM | POA: Diagnosis not present

## 2016-02-07 ENCOUNTER — Other Ambulatory Visit (HOSPITAL_COMMUNITY): Payer: Self-pay | Admitting: Orthopaedic Surgery

## 2016-02-07 DIAGNOSIS — M25561 Pain in right knee: Secondary | ICD-10-CM

## 2016-02-07 DIAGNOSIS — M2391 Unspecified internal derangement of right knee: Secondary | ICD-10-CM

## 2016-02-17 ENCOUNTER — Ambulatory Visit (HOSPITAL_COMMUNITY): Admission: RE | Admit: 2016-02-17 | Payer: 59 | Source: Ambulatory Visit

## 2016-06-30 DIAGNOSIS — N159 Renal tubulo-interstitial disease, unspecified: Secondary | ICD-10-CM

## 2016-06-30 HISTORY — DX: Renal tubulo-interstitial disease, unspecified: N15.9

## 2016-11-29 ENCOUNTER — Encounter (HOSPITAL_COMMUNITY): Payer: Self-pay | Admitting: Emergency Medicine

## 2016-11-29 DIAGNOSIS — R05 Cough: Secondary | ICD-10-CM | POA: Diagnosis present

## 2016-11-29 DIAGNOSIS — R51 Headache: Secondary | ICD-10-CM | POA: Diagnosis not present

## 2016-11-29 NOTE — ED Triage Notes (Signed)
Patient did not answer when called for triage x 1.

## 2016-11-29 NOTE — ED Triage Notes (Signed)
Pt to ED from home c/o cold and sinus symptoms x 4 days. Pt states the bilateral ear pressure and pain started first, accompanied by nasal drainage ("yellowish") and non-productive, dry cough. Pt states she normally doesn't have allergies, but the past few months, has experienced minor seasonal allergy symptoms. Pt has been taking OTC tylenol and ibuprofen with no relief. Denies SOB/CP/dizziness. Resp e/u, lung sounds clear.

## 2016-11-30 ENCOUNTER — Emergency Department (HOSPITAL_COMMUNITY)
Admission: EM | Admit: 2016-11-30 | Discharge: 2016-11-30 | Disposition: A | Discharge status: Home / Self Care | Payer: 59 | Attending: Emergency Medicine | Admitting: Emergency Medicine

## 2016-11-30 HISTORY — DX: Renal tubulo-interstitial disease, unspecified: N15.9

## 2016-11-30 NOTE — ED Notes (Signed)
Patient did not answer when called to be roomed.  

## 2016-11-30 NOTE — ED Notes (Signed)
Patient up to desk and asking about wait.  Asking if she can go home.  This RN encouraged patient to stay and offered her support.  Patient states she will wait and consider.

## 2016-11-30 NOTE — ED Notes (Signed)
Pt called for room no answer Nurse First aware

## 2017-08-14 ENCOUNTER — Ambulatory Visit (HOSPITAL_COMMUNITY)
Admission: EM | Admit: 2017-08-14 | Discharge: 2017-08-14 | Disposition: A | Discharge status: Home / Self Care | Payer: Managed Care, Other (non HMO) | Attending: Family Medicine | Admitting: Family Medicine

## 2017-08-14 ENCOUNTER — Encounter (HOSPITAL_COMMUNITY): Payer: Self-pay | Admitting: Family Medicine

## 2017-08-14 DIAGNOSIS — Z3201 Encounter for pregnancy test, result positive: Secondary | ICD-10-CM | POA: Diagnosis not present

## 2017-08-14 DIAGNOSIS — M5489 Other dorsalgia: Secondary | ICD-10-CM | POA: Diagnosis not present

## 2017-08-14 DIAGNOSIS — Z3490 Encounter for supervision of normal pregnancy, unspecified, unspecified trimester: Secondary | ICD-10-CM

## 2017-08-14 LAB — POCT URINALYSIS DIP (DEVICE)
Bilirubin Urine: NEGATIVE
GLUCOSE, UA: NEGATIVE mg/dL
Hgb urine dipstick: NEGATIVE
Ketones, ur: NEGATIVE mg/dL
Leukocytes, UA: NEGATIVE
Nitrite: NEGATIVE
PROTEIN: NEGATIVE mg/dL
SPECIFIC GRAVITY, URINE: 1.02 (ref 1.005–1.030)
UROBILINOGEN UA: 0.2 mg/dL (ref 0.0–1.0)
pH: 6.5 (ref 5.0–8.0)

## 2017-08-14 LAB — POCT PREGNANCY, URINE: Preg Test, Ur: POSITIVE — AB

## 2017-08-14 MED ORDER — PRENATAL VITAMIN 27-0.8 MG PO TABS: 1.0000 | ORAL_TABLET | Freq: Every day | ORAL | 3 refills | Status: AC

## 2017-08-14 NOTE — ED Triage Notes (Signed)
Pt here for 2 weeks or worsening back pain and right side pain. Denies urinary symptoms. reports hx of kidney infections.

## 2017-08-14 NOTE — ED Provider Notes (Signed)
[facility]   [id number] 08/14/17 Arrival Time: 1837   SUBJECTIVE:  Erica Jennings is a 27 y.o. female who presents to the urgent care with complaint of 2 weeks or worsening back pain and right side pain. Denies urinary symptoms. reports hx of kidney infections.   Works at [employer]  Past Medical History:  Diagnosis Date  . Anemia   . Kidney infection 06/30/2016   Family History  Problem Relation Age of Onset  . Alcohol abuse Neg Hx   . Arthritis Neg Hx   . Asthma Neg Hx   . Birth defects Neg Hx   . Cancer Neg Hx   . COPD Neg Hx   . Depression Neg Hx   . Diabetes Neg Hx   . Drug abuse Neg Hx   . Early death Neg Hx   . Hearing loss Neg Hx   . Heart disease Neg Hx   . Hyperlipidemia Neg Hx   . Hypertension Neg Hx   . Kidney disease Neg Hx   . Learning disabilities Neg Hx   . Mental illness Neg Hx   . Mental retardation Neg Hx   . Miscarriages / Stillbirths Neg Hx   . Stroke Neg Hx   . Vision loss Neg Hx   . Varicose Veins Neg Hx    Social History   Socioeconomic History  . Marital status: Single    Spouse name: Not on file  . Number of children: Not on file  . Years of education: Not on file  . Highest education level: Not on file  Social Needs  . Financial resource strain: Not on file  . Food insecurity - worry: Not on file  . Food insecurity - inability: Not on file  . Transportation needs - medical: Not on file  . Transportation needs - non-medical: Not on file  Occupational History  . Not on file  Tobacco Use  . Smoking status: Never Smoker  . Smokeless tobacco: Never Used  Substance and Sexual Activity  . Alcohol use: No  . Drug use: No  . Sexual activity: Yes    Birth control/protection: None  Other Topics Concern  . Not on file  Social History Narrative  . Not on file   No outpatient medications have been marked as taking for the 08/14/17 encounter [facility] Encounter).   No Known Allergies    ROS: As per HPI, remainder of ROS  negative.   OBJECTIVE:   Vitals:   08/14/17 1916  BP: 113/72  Pulse: 81  Resp: 18  Temp: 98.4 F (36.9 C)  SpO2: 100%     General appearance: alert; no distress Eyes: PERRL; EOMI; conjunctiva normal HENT: normocephalic; atraumatic; TMs normal, canal normal, external ears normal without trauma; nasal mucosa normal; oral mucosa normal Neck: supple Lungs: clear to auscultation bilaterally Heart: regular rate and rhythm Abdomen: soft, non-tender; bowel sounds normal; no masses or organomegaly; no guarding or rebound tenderness Back: no CVA tenderness Extremities: no cyanosis or edema; symmetrical with no gross deformities Skin: warm and dry Neurologic: normal gait; grossly normal Psychological: alert and cooperative; normal mood and affect      Labs:  Results for orders placed or performed during the hospital encounter of 08/14/17  POCT urinalysis dip (device)  Result Value Ref Range   Glucose, UA NEGATIVE NEGATIVE mg/dL   Bilirubin Urine NEGATIVE NEGATIVE   Ketones, ur NEGATIVE NEGATIVE mg/dL   Specific Gravity, Urine 1.020 1.005 - 1.030   Hgb urine dipstick NEGATIVE NEGATIVE  pH 6.5 5.0 - 8.0   Protein, ur NEGATIVE NEGATIVE mg/dL   Urobilinogen, UA 0.2 0.0 - 1.0 mg/dL   Nitrite NEGATIVE NEGATIVE   Leukocytes, UA NEGATIVE NEGATIVE  Pregnancy, urine POC  Result Value Ref Range   Preg Test, Ur POSITIVE (A) NEGATIVE    Labs Reviewed  POCT PREGNANCY, URINE - Abnormal; Notable for the following components:      Result Value   Preg Test, Ur POSITIVE (*)    All other components within normal limits  POCT URINALYSIS DIP (DEVICE)    No results found.     ASSESSMENT & PLAN:  1. Pregnancy, unspecified gestational age     Meds ordered this encounter  Medications  . Prenatal Vit-Fe Fumarate-FA (PRENATAL VITAMIN) 27-0.8 MG TABS    Sig: Take 1 tablet by mouth daily.    Dispense:  30 tablet    Refill:  3    Reviewed expectations re: course of current  medical issues. Questions answered. Outlined signs and symptoms indicating need for more acute intervention. Patient verbalized understanding. After Visit Summary given.    Procedures:      Elvina SidleLauenstein, Summerlyn Fickel, MD 08/14/17 1946

## 2019-12-12 ENCOUNTER — Inpatient Hospital Stay (HOSPITAL_COMMUNITY)
Admission: AD | Admit: 2019-12-12 | Discharge: 2019-12-12 | Disposition: A | Discharge status: Home / Self Care | Payer: Managed Care, Other (non HMO) | Attending: Obstetrics and Gynecology | Admitting: Obstetrics and Gynecology

## 2019-12-12 ENCOUNTER — Other Ambulatory Visit: Payer: Self-pay

## 2019-12-12 DIAGNOSIS — N920 Excessive and frequent menstruation with regular cycle: Secondary | ICD-10-CM | POA: Insufficient documentation

## 2019-12-12 DIAGNOSIS — Z3202 Encounter for pregnancy test, result negative: Secondary | ICD-10-CM

## 2019-12-12 LAB — POCT PREGNANCY, URINE: Preg Test, Ur: NEGATIVE

## 2019-12-12 LAB — URINALYSIS, ROUTINE W REFLEX MICROSCOPIC
Bacteria, UA: NONE SEEN
Bilirubin Urine: NEGATIVE
Glucose, UA: NEGATIVE mg/dL
Ketones, ur: NEGATIVE mg/dL
Leukocytes,Ua: NEGATIVE
Nitrite: NEGATIVE
Protein, ur: 100 mg/dL — AB
RBC / HPF: >50 RBC/hpf — ABNORMAL HIGH (ref 0–5)
Specific Gravity, Urine: 1.028 (ref 1.005–1.030)
pH: 5 (ref 5.0–8.0)

## 2019-12-12 NOTE — MAU Note (Signed)
Pt reports to MAU c/o menstrual like cramping in her abdomen and back. Pt reports she passed a "clot" that looked like a fetus. Pt denies having a +pregnancy test. Pt states two weeks ago she took a test before placing her new nuva ring and it was negative. pts LMP was in April.

## 2019-12-12 NOTE — MAU Provider Note (Signed)
S Ms. Erica Jennings is a 29 y.o. G9P1001 female who presents to MAU today with complaint of passing a clot/tissue. She is worried she may be having a miscarriage.  O BP 119/76 (BP Location: Right Arm)   Pulse 76   Temp 98.2 F (36.8 C) (Oral)   Resp 18   LMP 09/29/2019 (Exact Date)  Physical Exam  Nursing note and vitals reviewed. Constitutional: She is oriented to person, place, and time. No distress.  HENT:  Head: Normocephalic and atraumatic.  Cardiovascular: Normal rate.  Respiratory: Effort normal. No respiratory distress.  Neurological: She is alert and oriented to person, place, and time.  Psychiatric: Mood normal.   Results for orders placed or performed during the hospital encounter of 12/12/19 (from the past 24 hour(s))  Urinalysis, Routine w reflex microscopic     Status: Abnormal   Collection Time: 12/12/19 11:01 PM  Result Value Ref Range   Color, Urine YELLOW YELLOW   APPearance HAZY (A) CLEAR   Specific Gravity, Urine 1.028 1.005 - 1.030   pH 5.0 5.0 - 8.0   Glucose, UA NEGATIVE NEGATIVE mg/dL   Hgb urine dipstick LARGE (A) NEGATIVE   Bilirubin Urine NEGATIVE NEGATIVE   Ketones, ur NEGATIVE NEGATIVE mg/dL   Protein, ur 466 (A) NEGATIVE mg/dL   Nitrite NEGATIVE NEGATIVE   Leukocytes,Ua NEGATIVE NEGATIVE   RBC / HPF >50 (H) 0 - 5 RBC/hpf   WBC, UA 0-5 0 - 5 WBC/hpf   Bacteria, UA NONE SEEN NONE SEEN   Squamous Epithelial / LPF 0-5 0 - 5   Mucus PRESENT   Pregnancy, urine POC     Status: None   Collection Time: 12/12/19 11:02 PM  Result Value Ref Range   Preg Test, Ur NEGATIVE NEGATIVE   A Non pregnant female Medical screening exam complete  P Discharge from MAU in stable condition Patient given the option of transfer to [facility] for further evaluation or seek care in outpatient facility of choice Pt prefers to f/u with her GYN in [place] Patient may return to MAU as needed for pregnancy related complaints  Donette Larry, [place] 12/12/2019 11:31 PM

## 2023-07-04 ENCOUNTER — Inpatient Hospital Stay (HOSPITAL_COMMUNITY): Payer: PRIVATE HEALTH INSURANCE

## 2023-07-04 ENCOUNTER — Encounter (HOSPITAL_COMMUNITY): Payer: Self-pay | Admitting: Obstetrics & Gynecology

## 2023-07-04 ENCOUNTER — Inpatient Hospital Stay (HOSPITAL_COMMUNITY)
Admission: AD | Admit: 2023-07-04 | Discharge: 2023-07-04 | Disposition: A | Discharge status: Home / Self Care | Payer: PRIVATE HEALTH INSURANCE | Attending: Obstetrics & Gynecology | Admitting: Obstetrics & Gynecology

## 2023-07-04 ENCOUNTER — Other Ambulatory Visit: Payer: Self-pay

## 2023-07-04 DIAGNOSIS — Z3A09 9 weeks gestation of pregnancy: Secondary | ICD-10-CM | POA: Insufficient documentation

## 2023-07-04 DIAGNOSIS — O26891 Other specified pregnancy related conditions, first trimester: Secondary | ICD-10-CM | POA: Diagnosis present

## 2023-07-04 DIAGNOSIS — O23592 Infection of other part of genital tract in pregnancy, second trimester: Secondary | ICD-10-CM | POA: Insufficient documentation

## 2023-07-04 DIAGNOSIS — O98311 Other infections with a predominantly sexual mode of transmission complicating pregnancy, first trimester: Secondary | ICD-10-CM | POA: Diagnosis not present

## 2023-07-04 DIAGNOSIS — N390 Urinary tract infection, site not specified: Secondary | ICD-10-CM | POA: Diagnosis not present

## 2023-07-04 DIAGNOSIS — A5901 Trichomonal vulvovaginitis: Secondary | ICD-10-CM | POA: Diagnosis not present

## 2023-07-04 DIAGNOSIS — O2341 Unspecified infection of urinary tract in pregnancy, first trimester: Secondary | ICD-10-CM | POA: Diagnosis not present

## 2023-07-04 DIAGNOSIS — M545 Low back pain, unspecified: Secondary | ICD-10-CM | POA: Insufficient documentation

## 2023-07-04 DIAGNOSIS — Z3491 Encounter for supervision of normal pregnancy, unspecified, first trimester: Secondary | ICD-10-CM

## 2023-07-04 DIAGNOSIS — R1032 Left lower quadrant pain: Secondary | ICD-10-CM | POA: Diagnosis not present

## 2023-07-04 DIAGNOSIS — Z87440 Personal history of urinary (tract) infections: Secondary | ICD-10-CM | POA: Insufficient documentation

## 2023-07-04 LAB — URINALYSIS, ROUTINE W REFLEX MICROSCOPIC
Bilirubin Urine: NEGATIVE
Glucose, UA: NEGATIVE mg/dL
Hgb urine dipstick: NEGATIVE
Ketones, ur: NEGATIVE mg/dL
Nitrite: POSITIVE — AB
Protein, ur: NEGATIVE mg/dL
Specific Gravity, Urine: 1.028 (ref 1.005–1.030)
pH: 6 (ref 5.0–8.0)

## 2023-07-04 LAB — WET PREP, GENITAL
Clue Cells Wet Prep HPF POC: NONE SEEN
Sperm: NONE SEEN
WBC, Wet Prep HPF POC: >=10 — AB (ref <10)
Yeast Wet Prep HPF POC: NONE SEEN

## 2023-07-04 LAB — CBC
HCT: 34.7 % — ABNORMAL LOW (ref 36.0–46.0)
Hemoglobin: 10.6 g/dL — ABNORMAL LOW (ref 12.0–15.0)
MCH: 20.1 pg — ABNORMAL LOW (ref 26.0–34.0)
MCHC: 30.5 g/dL (ref 30.0–36.0)
MCV: 65.8 fL — ABNORMAL LOW (ref 80.0–100.0)
Platelets: 286 10*3/uL (ref 150–400)
RBC: 5.27 MIL/uL — ABNORMAL HIGH (ref 3.87–5.11)
RDW: 17.1 % — ABNORMAL HIGH (ref 11.5–15.5)
WBC: 8.4 10*3/uL (ref 4.0–10.5)
nRBC: 0 % (ref 0.0–0.2)

## 2023-07-04 LAB — HCG, QUANTITATIVE, PREGNANCY: hCG, Beta Chain, Quant, S: 136234 m[IU]/mL — ABNORMAL HIGH (ref <5)

## 2023-07-04 LAB — POCT PREGNANCY, URINE: Preg Test, Ur: POSITIVE — AB

## 2023-07-04 MED ORDER — NITROFURANTOIN MONOHYD MACRO 100 MG PO CAPS: 100.0000 mg | ORAL_CAPSULE | Freq: Two times a day (BID) | ORAL | 0 refills | Status: AC

## 2023-07-04 MED ORDER — METRONIDAZOLE 500 MG PO TABS
500.0000 mg | ORAL_TABLET | Freq: Two times a day (BID) | ORAL | 0 refills | Status: AC
Start: 2023-07-04 — End: ?

## 2023-07-04 NOTE — MAU Provider Note (Signed)
 Chief Complaint:  Abdominal Pain and Back Pain   HPI    Erica Jennings is a 33 y.o. G2P1001 at [redacted]w[redacted]d who presents to maternity admissions reporting lower abdominal cramping LLQ pain scale initially at 6/10. She also c/o lower back pain and stated she has a H/O UTI's. Denies dysuria currently. She denied taking any medication for pain relief but reports she has Tylenol  at home and is taking PNV. She lives in [place] and plans on establishing [facility] in [place]. Denies any VB or LOF   Pregnancy Course: Planning to establish prenatal care in [facility]   Past Medical History:  Diagnosis Date   Anemia    Kidney infection 06/30/2016   OB History  Gravida Para Term Preterm AB Living  2 1 1   1   SAB IAB Ectopic Multiple Live Births     0 1    # Outcome Date GA Lbr Len/2nd Weight Sex Type Anes PTL Lv  2 Current           1 Term 06/10/14 [redacted]w[redacted]d 05:41 / 00:32 3310 g M Vag-Spont EPI  LIV     Birth Comments: WNL   Past Surgical History:  Procedure Laterality Date   NO PAST SURGERIES     Family History  Problem Relation Age of Onset   Alcohol abuse Neg Hx    Arthritis Neg Hx    Asthma Neg Hx    Birth defects Neg Hx    Cancer Neg Hx    COPD Neg Hx    Depression Neg Hx    Diabetes Neg Hx    Drug abuse Neg Hx    Early death Neg Hx    Hearing loss Neg Hx    Heart disease Neg Hx    Hyperlipidemia Neg Hx    Hypertension Neg Hx    Kidney disease Neg Hx    Learning disabilities Neg Hx    Mental illness Neg Hx    Mental retardation Neg Hx    Miscarriages / Stillbirths Neg Hx    Stroke Neg Hx    Vision loss Neg Hx    Varicose Veins Neg Hx    Social History   Tobacco Use   Smoking status: Never   Smokeless tobacco: Never  Vaping Use   Vaping status: Never Used  Substance Use Topics   Alcohol use: No   Drug use: No   No Known Allergies No medications prior to admission.    I have reviewed patient's Past Medical Hx, Surgical Hx, Family Hx, Social Hx, medications and allergies.   ROS   Pertinent items noted in HPI and remainder of comprehensive ROS otherwise negative.   PHYSICAL EXAM  Patient Vitals for the past 24 hrs:  BP Temp Temp src Pulse Resp SpO2 Height Weight  07/04/23 1537 114/69 98 F (36.7 C) Oral 81 18 99 % -- --  07/04/23 1532 -- -- -- -- -- -- 5' 1 (1.549 m) 64.5 kg    Constitutional: Well-developed, well-nourished female in no acute distress.  Cardiovascular: normal rate & rhythm, warm and well-perfused Respiratory: normal effort, no problems with respiration noted GI: Abd soft, non-tender, non-distended MS: Extremities nontender, no edema, normal ROM Neurologic: Alert and oriented x 4.  GU: no CVA tenderness    Labs: Results for orders placed or performed during the hospital encounter of 07/04/23 (from the past 24 hours)  Urinalysis, Routine w reflex microscopic -Urine, Clean Catch     Status: Abnormal   Collection Time: 07/04/23  3:00 PM  Result Value Ref Range   Color, Urine YELLOW YELLOW   APPearance HAZY (A) CLEAR   Specific Gravity, Urine 1.028 1.005 - 1.030   pH 6.0 5.0 - 8.0   Glucose, UA NEGATIVE NEGATIVE mg/dL   Hgb urine dipstick NEGATIVE NEGATIVE   Bilirubin Urine NEGATIVE NEGATIVE   Ketones, ur NEGATIVE NEGATIVE mg/dL   Protein, ur NEGATIVE NEGATIVE mg/dL   Nitrite POSITIVE (A) NEGATIVE   Leukocytes,Ua SMALL (A) NEGATIVE   RBC / HPF 6-10 0 - 5 RBC/hpf   WBC, UA 11-20 0 - 5 WBC/hpf   Bacteria, UA MANY (A) NONE SEEN   Squamous Epithelial / HPF 0-5 0 - 5 /HPF   Mucus PRESENT   Wet prep, genital     Status: Abnormal   Collection Time: 07/04/23  3:08 PM   Specimen: PATH Cytology Cervicovaginal Ancillary Only  Result Value Ref Range   Yeast Wet Prep HPF POC NONE SEEN NONE SEEN   Trich, Wet Prep PRESENT (A) NONE SEEN   Clue Cells Wet Prep HPF POC NONE SEEN NONE SEEN   WBC, Wet Prep HPF POC >=10 (A) <10   Sperm NONE SEEN   Pregnancy, urine POC     Status: Abnormal   Collection Time: 07/04/23  3:12 PM  Result Value Ref Range    Preg Test, Ur POSITIVE (A) NEGATIVE  CBC     Status: Abnormal   Collection Time: 07/04/23  3:57 PM  Result Value Ref Range   WBC 8.4 4.0 - 10.5 K/uL   RBC 5.27 (H) 3.87 - 5.11 MIL/uL   Hemoglobin 10.6 (L) 12.0 - 15.0 g/dL   HCT 65.2 (L) 63.9 - 53.9 %   MCV 65.8 (L) 80.0 - 100.0 fL   MCH 20.1 (L) 26.0 - 34.0 pg   MCHC 30.5 30.0 - 36.0 g/dL   RDW 82.8 (H) 88.4 - 84.4 %   Platelets 286 150 - 400 K/uL   nRBC 0.0 0.0 - 0.2 %  hCG, quantitative, pregnancy     Status: Abnormal   Collection Time: 07/04/23  3:57 PM  Result Value Ref Range   hCG, Beta Chain, Quant, S 136,234 (H) <5 mIU/mL    Imaging:  US  OB Comp Less 14 Wks Result Date: 07/04/2023 CLINICAL DATA:  Initial evaluation for acute lower abdominal pain, early pregnancy. EXAM: OBSTETRIC <14 WK ULTRASOUND TECHNIQUE: Transabdominal ultrasound was performed for evaluation of the gestation as well as the maternal uterus and adnexal regions. COMPARISON:  None Available. FINDINGS: Intrauterine gestational sac: Single Yolk sac:  Present Embryo:  Present Cardiac Activity: Present Heart Rate: 178 bpm CRL:   26.7 mm   9 w 4 d                  US  EDC: 02/02/2024 Subchorionic hemorrhage: Small subchorionic hemorrhage measuring 2.6 x 1.4 x 0.8 cm without mass effect. Maternal uterus/adnexae: Left ovary within normal limits. 2 cm right ovarian corpus luteal cyst. No other adnexal mass or free fluid. IMPRESSION: 1. Single viable IUP, estimated gestational age [redacted] weeks and 4 days by crown-rump length, with ultrasound EDC of 02/02/2024. 2. Small subchorionic hemorrhage as above. 3. 2 cm right ovarian corpus luteal cyst. Electronically Signed   By: Morene Hoard M.D.   On: 07/04/2023 18:12    MDM & MAU COURSE  MDM:  HIGH  Patient was seen and evaluated by me after chart reviewed. Labs and imaging ordered and reviewed by me. Patient was DX and treated  as indicated below for trichomonas and UTI in pregnancy @ 9.2 GA. Patient verbalized understanding  and agrees with plan as described below.  MAU Course: Orders Placed This Encounter  Procedures   Wet prep, genital   Culture, OB Urine   US  OB Comp Less 14 Wks   Urinalysis, Routine w reflex microscopic -Urine, Clean Catch   CBC   hCG, quantitative, pregnancy   Pregnancy, urine POC   Discharge patient    ASSESSMENT   1. Normal intrauterine pregnancy on prenatal ultrasound in first trimester   2. Trichomonal vaginitis during pregnancy in first trimester   3. Urinary tract infection in mother during first trimester of pregnancy   4. [redacted] weeks gestation of pregnancy     PLAN  Discharge home in stable condition with return precautions.      Allergies as of 07/04/2023   No Known Allergies      Medication List     STOP taking these medications    ibuprofen  600 MG tablet Commonly known as: ADVIL        TAKE these medications    metroNIDAZOLE  500 MG tablet Commonly known as: FLAGYL  Take 1 tablet (500 mg total) by mouth 2 (two) times daily.   nitrofurantoin  (macrocrystal-monohydrate) 100 MG capsule Commonly known as: MACROBID  Take 1 capsule (100 mg total) by mouth 2 (two) times daily.   prenatal multivitamin Tabs tablet Take 1 tablet by mouth daily at 12 noon.   Prenatal Vitamin 27-0.8 MG Tabs Take 1 tablet by mouth daily.       Olam Dalton, MSN, [facility] [employer]

## 2023-07-04 NOTE — MAU Note (Signed)
 Erica Jennings is a 33 y.o. at Unknown here in MAU reporting: she's having lower abdominal cramping and intermittent sharp LLQ pain that began Monday night.  Also complains of constant aching lower back pain.  Denies VB.  LMP: 04/30/2023 Onset of complaint: Monday Pain score: 6 abdomen  Vitals:   07/04/23 1537  BP: 114/69  Pulse: 81  Resp: 18  Temp: 98 F (36.7 C)  SpO2: 99%     FHT:NA Lab orders placed from triage: UPT & UA

## 2023-07-05 LAB — GC/CHLAMYDIA PROBE AMP (~~LOC~~) NOT AT ARMC
Chlamydia: NEGATIVE
Comment: NEGATIVE
Comment: NORMAL
Neisseria Gonorrhea: NEGATIVE

## 2023-07-07 LAB — CULTURE, OB URINE: Culture: >=100000 — AB

## 2023-11-20 DIAGNOSIS — Z0279 Encounter for issue of other medical certificate: Secondary | ICD-10-CM
